# Patient Record
Sex: Male | Born: 1945
Health system: Western US, Academic
[De-identification: ages and names within clinical notes are randomized; demographics above are authoritative.]

## PROBLEM LIST (undated history)

## (undated) DIAGNOSIS — J449 Chronic obstructive pulmonary disease, unspecified: Secondary | ICD-10-CM

## (undated) DIAGNOSIS — S60459A Superficial foreign body of unspecified finger, initial encounter: Secondary | ICD-10-CM

## (undated) DIAGNOSIS — G459 Transient cerebral ischemic attack, unspecified: Secondary | ICD-10-CM

## (undated) DIAGNOSIS — I10 Essential (primary) hypertension: Secondary | ICD-10-CM

## (undated) DIAGNOSIS — C801 Malignant (primary) neoplasm, unspecified: Secondary | ICD-10-CM

## (undated) DIAGNOSIS — K219 Gastro-esophageal reflux disease without esophagitis: Secondary | ICD-10-CM

## (undated) DIAGNOSIS — G4730 Sleep apnea, unspecified: Secondary | ICD-10-CM

## (undated) DIAGNOSIS — I509 Heart failure, unspecified: Secondary | ICD-10-CM

## (undated) DIAGNOSIS — H269 Unspecified cataract: Secondary | ICD-10-CM

## (undated) DIAGNOSIS — D72819 Decreased white blood cell count, unspecified: Secondary | ICD-10-CM

## (undated) DIAGNOSIS — I4891 Unspecified atrial fibrillation: Secondary | ICD-10-CM

## (undated) DIAGNOSIS — L089 Local infection of the skin and subcutaneous tissue, unspecified: Secondary | ICD-10-CM

## (undated) DIAGNOSIS — M1990 Unspecified osteoarthritis, unspecified site: Secondary | ICD-10-CM

## (undated) DIAGNOSIS — Q249 Congenital malformation of heart, unspecified: Secondary | ICD-10-CM

## (undated) DIAGNOSIS — I2510 Atherosclerotic heart disease of native coronary artery without angina pectoris: Secondary | ICD-10-CM

## (undated) HISTORY — DX: Sleep apnea, unspecified: G47.30

## (undated) HISTORY — DX: Essential (primary) hypertension: I10

## (undated) HISTORY — DX: Chronic obstructive pulmonary disease, unspecified: J44.9

## (undated) HISTORY — DX: Unspecified osteoarthritis, unspecified site: M19.90

## (undated) HISTORY — PX: OTHER SURGICAL HISTORY: SHX169

## (undated) HISTORY — DX: Transient cerebral ischemic attack, unspecified: G45.9

## (undated) HISTORY — DX: Local infection of the skin and subcutaneous tissue, unspecified: L08.9

## (undated) HISTORY — DX: Malignant (primary) neoplasm, unspecified: C80.1

## (undated) HISTORY — DX: Congenital malformation of heart, unspecified: Q24.9

## (undated) HISTORY — DX: Superficial foreign body of unspecified finger, initial encounter: S60.459A

## (undated) HISTORY — DX: Unspecified atrial fibrillation: I48.91

## (undated) HISTORY — DX: Atherosclerotic heart disease of native coronary artery without angina pectoris: I25.10

## (undated) HISTORY — PX: TONSILLECTOMY: SUR1361

## (undated) HISTORY — DX: Gastro-esophageal reflux disease without esophagitis: K21.9

## (undated) HISTORY — DX: Decreased white blood cell count, unspecified: D72.819

## (undated) HISTORY — PX: CATARACT EXTRACTION: SUR2

## (undated) HISTORY — DX: Unspecified cataract: H26.9

---

## 1999-01-31 ENCOUNTER — Ambulatory Visit (HOSPITAL_COMMUNITY): Admission: RE | Admit: 1999-01-31 | Discharge: 1999-01-31 | Payer: Self-pay | Admitting: Cardiology

## 2000-05-22 ENCOUNTER — Encounter: Payer: Self-pay | Admitting: Nephrology

## 2000-05-22 ENCOUNTER — Encounter: Admission: RE | Admit: 2000-05-22 | Discharge: 2000-05-22 | Payer: Self-pay | Admitting: Nephrology

## 2001-05-11 DIAGNOSIS — K449 Diaphragmatic hernia without obstruction or gangrene: Secondary | ICD-10-CM | POA: Insufficient documentation

## 2001-05-12 ENCOUNTER — Encounter: Payer: Self-pay | Admitting: Gastroenterology

## 2004-07-25 ENCOUNTER — Ambulatory Visit: Payer: Self-pay | Admitting: Gastroenterology

## 2004-08-09 ENCOUNTER — Ambulatory Visit: Payer: Self-pay | Admitting: Cardiovascular Disease

## 2004-08-14 ENCOUNTER — Ambulatory Visit: Payer: Self-pay | Admitting: Gastroenterology

## 2004-09-07 ENCOUNTER — Ambulatory Visit: Payer: Self-pay | Admitting: Internal Medicine

## 2004-09-11 ENCOUNTER — Ambulatory Visit: Payer: Self-pay | Admitting: Cardiology

## 2004-10-09 ENCOUNTER — Ambulatory Visit: Payer: Self-pay | Admitting: *Deleted

## 2004-10-30 ENCOUNTER — Ambulatory Visit: Payer: Self-pay | Admitting: Cardiology

## 2004-11-14 ENCOUNTER — Ambulatory Visit: Payer: Self-pay | Admitting: *Deleted

## 2004-12-11 ENCOUNTER — Ambulatory Visit: Payer: Self-pay | Admitting: *Deleted

## 2005-01-01 ENCOUNTER — Ambulatory Visit: Payer: Self-pay | Admitting: Cardiology

## 2005-01-18 ENCOUNTER — Ambulatory Visit: Payer: Self-pay | Admitting: Internal Medicine

## 2005-01-22 ENCOUNTER — Ambulatory Visit (HOSPITAL_COMMUNITY): Admission: RE | Admit: 2005-01-22 | Discharge: 2005-01-22 | Payer: Self-pay | Admitting: Internal Medicine

## 2005-01-29 ENCOUNTER — Ambulatory Visit: Payer: Self-pay | Admitting: Cardiology

## 2005-02-09 ENCOUNTER — Emergency Department (HOSPITAL_COMMUNITY): Admission: EM | Admit: 2005-02-09 | Discharge: 2005-02-09 | Payer: Self-pay | Admitting: Emergency Medicine

## 2005-02-13 ENCOUNTER — Ambulatory Visit: Payer: Self-pay | Admitting: Internal Medicine

## 2005-02-28 ENCOUNTER — Ambulatory Visit: Payer: Self-pay | Admitting: Cardiovascular Disease

## 2005-02-28 ENCOUNTER — Ambulatory Visit: Payer: Self-pay | Admitting: Cardiology

## 2005-03-02 ENCOUNTER — Ambulatory Visit (HOSPITAL_COMMUNITY): Admission: RE | Admit: 2005-03-02 | Discharge: 2005-03-02 | Payer: Self-pay | Admitting: Neurology

## 2005-03-14 ENCOUNTER — Ambulatory Visit: Payer: Self-pay | Admitting: Cardiology

## 2005-04-03 ENCOUNTER — Ambulatory Visit: Payer: Self-pay | Admitting: Cardiology

## 2005-05-01 ENCOUNTER — Ambulatory Visit: Payer: Self-pay | Admitting: Cardiology

## 2005-05-14 ENCOUNTER — Ambulatory Visit: Payer: Self-pay | Admitting: Cardiology

## 2005-05-30 ENCOUNTER — Ambulatory Visit: Payer: Self-pay | Admitting: Cardiovascular Disease

## 2005-06-14 ENCOUNTER — Ambulatory Visit: Payer: Self-pay | Admitting: Internal Medicine

## 2005-06-20 ENCOUNTER — Ambulatory Visit: Payer: Self-pay | Admitting: Cardiology

## 2005-06-29 ENCOUNTER — Ambulatory Visit: Payer: Self-pay | Admitting: Internal Medicine

## 2005-07-18 ENCOUNTER — Ambulatory Visit: Payer: Self-pay | Admitting: Cardiology

## 2005-08-13 ENCOUNTER — Ambulatory Visit: Payer: Self-pay | Admitting: Internal Medicine

## 2005-08-13 ENCOUNTER — Ambulatory Visit: Payer: Self-pay | Admitting: Cardiology

## 2005-08-27 ENCOUNTER — Ambulatory Visit: Payer: Self-pay | Admitting: Internal Medicine

## 2005-09-10 ENCOUNTER — Ambulatory Visit: Payer: Self-pay | Admitting: Internal Medicine

## 2005-09-21 ENCOUNTER — Ambulatory Visit: Payer: Self-pay | Admitting: Cardiology

## 2005-10-08 ENCOUNTER — Ambulatory Visit: Payer: Self-pay | Admitting: Internal Medicine

## 2005-10-29 ENCOUNTER — Ambulatory Visit: Payer: Self-pay | Admitting: Cardiology

## 2005-11-05 ENCOUNTER — Emergency Department (HOSPITAL_COMMUNITY): Admission: EM | Admit: 2005-11-05 | Discharge: 2005-11-05 | Payer: Self-pay | Admitting: Emergency Medicine

## 2005-11-05 ENCOUNTER — Ambulatory Visit: Payer: Self-pay | Admitting: Internal Medicine

## 2005-11-07 ENCOUNTER — Ambulatory Visit: Payer: Self-pay | Admitting: Internal Medicine

## 2005-11-12 ENCOUNTER — Ambulatory Visit: Payer: Self-pay | Admitting: Cardiology

## 2005-11-13 ENCOUNTER — Ambulatory Visit: Payer: Self-pay | Admitting: Internal Medicine

## 2005-12-04 ENCOUNTER — Ambulatory Visit: Payer: Self-pay | Admitting: Internal Medicine

## 2005-12-05 ENCOUNTER — Ambulatory Visit: Payer: Self-pay | Admitting: Internal Medicine

## 2005-12-05 ENCOUNTER — Ambulatory Visit: Payer: Self-pay | Admitting: Cardiology

## 2005-12-07 ENCOUNTER — Ambulatory Visit: Payer: Self-pay | Admitting: Emergency Medicine

## 2005-12-12 ENCOUNTER — Ambulatory Visit: Payer: Self-pay | Admitting: Cardiology

## 2005-12-12 ENCOUNTER — Ambulatory Visit: Payer: Self-pay

## 2005-12-14 ENCOUNTER — Inpatient Hospital Stay (HOSPITAL_BASED_OUTPATIENT_CLINIC_OR_DEPARTMENT_OTHER): Admission: RE | Admit: 2005-12-14 | Discharge: 2005-12-14 | Payer: Self-pay | Admitting: Cardiology

## 2005-12-14 ENCOUNTER — Ambulatory Visit: Payer: Self-pay | Admitting: Cardiology

## 2005-12-17 ENCOUNTER — Ambulatory Visit: Payer: Self-pay | Admitting: Cardiology

## 2005-12-18 ENCOUNTER — Ambulatory Visit: Payer: Self-pay | Admitting: Internal Medicine

## 2005-12-24 ENCOUNTER — Ambulatory Visit: Payer: Self-pay | Admitting: Cardiology

## 2005-12-24 ENCOUNTER — Ambulatory Visit: Payer: Self-pay | Admitting: Internal Medicine

## 2005-12-24 ENCOUNTER — Ambulatory Visit (HOSPITAL_COMMUNITY): Admission: RE | Admit: 2005-12-24 | Discharge: 2005-12-24 | Payer: Self-pay | Admitting: Internal Medicine

## 2006-01-01 ENCOUNTER — Ambulatory Visit: Payer: Self-pay | Admitting: Internal Medicine

## 2006-01-14 ENCOUNTER — Ambulatory Visit: Payer: Self-pay | Admitting: Cardiology

## 2006-02-11 ENCOUNTER — Ambulatory Visit: Payer: Self-pay | Admitting: Cardiology

## 2006-02-26 ENCOUNTER — Ambulatory Visit: Payer: Self-pay | Admitting: Internal Medicine

## 2006-03-26 ENCOUNTER — Ambulatory Visit: Payer: Self-pay | Admitting: Cardiology

## 2006-04-09 ENCOUNTER — Ambulatory Visit: Payer: Self-pay | Admitting: Internal Medicine

## 2006-04-22 ENCOUNTER — Ambulatory Visit: Payer: Self-pay | Admitting: Cardiology

## 2006-05-16 ENCOUNTER — Ambulatory Visit: Payer: Self-pay | Admitting: Cardiology

## 2006-06-13 ENCOUNTER — Ambulatory Visit: Payer: Self-pay | Admitting: Cardiology

## 2006-07-11 ENCOUNTER — Ambulatory Visit: Payer: Self-pay | Admitting: Cardiology

## 2006-07-22 ENCOUNTER — Ambulatory Visit: Payer: Self-pay | Admitting: Cardiology

## 2006-08-14 ENCOUNTER — Ambulatory Visit: Payer: Self-pay | Admitting: Internal Medicine

## 2006-08-23 ENCOUNTER — Ambulatory Visit: Payer: Self-pay | Admitting: Cardiology

## 2006-09-10 ENCOUNTER — Ambulatory Visit: Payer: Self-pay | Admitting: Cardiology

## 2006-09-26 ENCOUNTER — Ambulatory Visit: Payer: Self-pay | Admitting: Cardiology

## 2006-10-21 ENCOUNTER — Ambulatory Visit: Payer: Self-pay | Admitting: Cardiology

## 2006-11-11 ENCOUNTER — Ambulatory Visit: Payer: Self-pay | Admitting: Cardiology

## 2006-12-09 ENCOUNTER — Ambulatory Visit: Payer: Self-pay | Admitting: Cardiology

## 2006-12-09 ENCOUNTER — Ambulatory Visit: Payer: Self-pay | Admitting: Internal Medicine

## 2006-12-09 LAB — CONVERTED CEMR LAB
ALT: 16 units/L (ref 0–40)
BUN: 17 mg/dL (ref 6–23)
Chloride: 104 meq/L (ref 96–112)
Creatinine, Ser: 1.2 mg/dL (ref 0.4–1.5)
GFR calc Af Amer: 79 mL/min
Glucose, Bld: 91 mg/dL (ref 70–99)
HDL: 54.6 mg/dL (ref >39.0)
Potassium: 4.2 meq/L (ref 3.5–5.1)
Total Bilirubin: 1.2 mg/dL (ref 0.3–1.2)
Total Protein: 6.2 g/dL (ref 6.0–8.3)
Triglycerides: 32 mg/dL (ref 0–149)
VLDL: 6 mg/dL (ref 0–40)

## 2007-01-06 ENCOUNTER — Ambulatory Visit: Payer: Self-pay | Admitting: Cardiology

## 2007-01-31 ENCOUNTER — Ambulatory Visit: Payer: Self-pay | Admitting: Internal Medicine

## 2007-02-03 ENCOUNTER — Ambulatory Visit: Payer: Self-pay | Admitting: Cardiology

## 2007-02-03 ENCOUNTER — Ambulatory Visit: Payer: Self-pay | Admitting: Internal Medicine

## 2007-02-10 ENCOUNTER — Ambulatory Visit: Payer: Self-pay | Admitting: Internal Medicine

## 2007-02-20 ENCOUNTER — Ambulatory Visit: Payer: Self-pay | Admitting: Cardiology

## 2007-03-13 ENCOUNTER — Ambulatory Visit: Payer: Self-pay | Admitting: Cardiology

## 2007-04-10 ENCOUNTER — Ambulatory Visit: Payer: Self-pay | Admitting: Cardiology

## 2007-04-24 ENCOUNTER — Ambulatory Visit: Payer: Self-pay | Admitting: Cardiology

## 2007-05-14 ENCOUNTER — Ambulatory Visit: Payer: Self-pay | Admitting: Cardiology

## 2007-06-18 ENCOUNTER — Ambulatory Visit: Payer: Self-pay | Admitting: Cardiology

## 2007-06-27 ENCOUNTER — Encounter: Payer: Self-pay | Admitting: Internal Medicine

## 2007-07-09 ENCOUNTER — Ambulatory Visit: Payer: Self-pay | Admitting: Cardiology

## 2007-07-15 ENCOUNTER — Ambulatory Visit: Payer: Self-pay | Admitting: Gastroenterology

## 2007-07-15 LAB — CONVERTED CEMR LAB: Tissue Transglutaminase Ab, IgA: 0 units (ref <7)

## 2007-08-06 ENCOUNTER — Ambulatory Visit: Payer: Self-pay | Admitting: Cardiology

## 2007-09-03 ENCOUNTER — Ambulatory Visit: Payer: Self-pay | Admitting: Cardiology

## 2007-09-23 ENCOUNTER — Ambulatory Visit: Payer: Self-pay | Admitting: Gastroenterology

## 2007-09-23 ENCOUNTER — Ambulatory Visit: Payer: Self-pay | Admitting: Internal Medicine

## 2007-09-23 LAB — CONVERTED CEMR LAB
ALT: 20 units/L (ref 0–53)
AST: 32 units/L (ref 0–37)
Bilirubin, Direct: 0.2 mg/dL (ref 0.0–0.3)
Total Protein: 6.9 g/dL (ref 6.0–8.3)

## 2007-10-06 ENCOUNTER — Ambulatory Visit: Payer: Self-pay | Admitting: Internal Medicine

## 2007-10-17 ENCOUNTER — Ambulatory Visit: Payer: Self-pay | Admitting: Internal Medicine

## 2007-10-17 DIAGNOSIS — I4891 Unspecified atrial fibrillation: Secondary | ICD-10-CM | POA: Insufficient documentation

## 2007-10-17 DIAGNOSIS — H269 Unspecified cataract: Secondary | ICD-10-CM | POA: Insufficient documentation

## 2007-10-17 DIAGNOSIS — M8580 Other specified disorders of bone density and structure, unspecified site: Secondary | ICD-10-CM | POA: Insufficient documentation

## 2007-10-21 ENCOUNTER — Encounter: Payer: Self-pay | Admitting: Internal Medicine

## 2007-10-21 ENCOUNTER — Ambulatory Visit: Payer: Self-pay | Admitting: Cardiology

## 2007-10-21 DIAGNOSIS — D72819 Decreased white blood cell count, unspecified: Secondary | ICD-10-CM | POA: Insufficient documentation

## 2007-10-21 DIAGNOSIS — K219 Gastro-esophageal reflux disease without esophagitis: Secondary | ICD-10-CM | POA: Insufficient documentation

## 2007-10-21 DIAGNOSIS — IMO0002 Reserved for concepts with insufficient information to code with codable children: Secondary | ICD-10-CM | POA: Insufficient documentation

## 2007-10-21 DIAGNOSIS — M1710 Unilateral primary osteoarthritis, unspecified knee: Secondary | ICD-10-CM

## 2007-10-21 DIAGNOSIS — J449 Chronic obstructive pulmonary disease, unspecified: Secondary | ICD-10-CM | POA: Insufficient documentation

## 2007-10-21 DIAGNOSIS — I10 Essential (primary) hypertension: Secondary | ICD-10-CM | POA: Insufficient documentation

## 2007-10-21 DIAGNOSIS — J4489 Other specified chronic obstructive pulmonary disease: Secondary | ICD-10-CM | POA: Insufficient documentation

## 2007-10-21 DIAGNOSIS — I2510 Atherosclerotic heart disease of native coronary artery without angina pectoris: Secondary | ICD-10-CM | POA: Insufficient documentation

## 2007-10-27 ENCOUNTER — Encounter: Payer: Self-pay | Admitting: Internal Medicine

## 2007-10-29 ENCOUNTER — Ambulatory Visit (HOSPITAL_BASED_OUTPATIENT_CLINIC_OR_DEPARTMENT_OTHER): Admission: RE | Admit: 2007-10-29 | Discharge: 2007-10-29 | Payer: Self-pay | Admitting: Internal Medicine

## 2007-10-29 ENCOUNTER — Encounter: Payer: Self-pay | Admitting: Pulmonary Disease

## 2007-11-04 ENCOUNTER — Ambulatory Visit: Payer: Self-pay | Admitting: Internal Medicine

## 2007-11-11 ENCOUNTER — Ambulatory Visit: Payer: Self-pay | Admitting: Pulmonary Disease

## 2007-11-12 ENCOUNTER — Ambulatory Visit: Payer: Self-pay | Admitting: Cardiology

## 2007-11-15 ENCOUNTER — Encounter: Payer: Self-pay | Admitting: Gastroenterology

## 2007-11-21 ENCOUNTER — Ambulatory Visit: Payer: Self-pay

## 2007-11-21 ENCOUNTER — Encounter: Payer: Self-pay | Admitting: Internal Medicine

## 2007-11-28 DIAGNOSIS — F528 Other sexual dysfunction not due to a substance or known physiological condition: Secondary | ICD-10-CM | POA: Insufficient documentation

## 2007-11-28 DIAGNOSIS — Z87898 Personal history of other specified conditions: Secondary | ICD-10-CM | POA: Insufficient documentation

## 2007-12-01 ENCOUNTER — Ambulatory Visit: Payer: Self-pay | Admitting: Cardiology

## 2007-12-04 ENCOUNTER — Ambulatory Visit: Payer: Self-pay | Admitting: Emergency Medicine

## 2007-12-04 DIAGNOSIS — G4733 Obstructive sleep apnea (adult) (pediatric): Secondary | ICD-10-CM | POA: Insufficient documentation

## 2007-12-10 ENCOUNTER — Ambulatory Visit: Payer: Self-pay | Admitting: Cardiology

## 2007-12-22 ENCOUNTER — Ambulatory Visit: Payer: Self-pay | Admitting: Cardiology

## 2007-12-22 LAB — CONVERTED CEMR LAB: Prothrombin Time: 12.5 s (ref 10.9–13.3)

## 2007-12-25 ENCOUNTER — Ambulatory Visit: Payer: Self-pay

## 2007-12-25 ENCOUNTER — Ambulatory Visit: Payer: Self-pay | Admitting: Cardiology

## 2007-12-25 LAB — CONVERTED CEMR LAB: Prothrombin Time: 15.6 s — ABNORMAL HIGH (ref 10.9–13.3)

## 2007-12-31 ENCOUNTER — Ambulatory Visit: Payer: Self-pay | Admitting: Internal Medicine

## 2008-01-05 ENCOUNTER — Ambulatory Visit (HOSPITAL_BASED_OUTPATIENT_CLINIC_OR_DEPARTMENT_OTHER): Admission: RE | Admit: 2008-01-05 | Discharge: 2008-01-05 | Payer: Self-pay | Admitting: Emergency Medicine

## 2008-01-08 ENCOUNTER — Ambulatory Visit: Payer: Self-pay | Admitting: Cardiology

## 2008-01-13 ENCOUNTER — Ambulatory Visit: Payer: Self-pay | Admitting: Cardiology

## 2008-01-15 ENCOUNTER — Ambulatory Visit: Payer: Self-pay | Admitting: Internal Medicine

## 2008-01-20 ENCOUNTER — Ambulatory Visit: Payer: Self-pay | Admitting: Pulmonary Disease

## 2008-01-21 ENCOUNTER — Ambulatory Visit: Payer: Self-pay | Admitting: Internal Medicine

## 2008-01-21 ENCOUNTER — Ambulatory Visit: Payer: Self-pay | Admitting: Cardiology

## 2008-01-28 ENCOUNTER — Ambulatory Visit: Payer: Self-pay | Admitting: Cardiology

## 2008-02-05 ENCOUNTER — Encounter: Payer: Self-pay | Admitting: Emergency Medicine

## 2008-02-05 ENCOUNTER — Ambulatory Visit: Payer: Self-pay | Admitting: Internal Medicine

## 2008-02-11 ENCOUNTER — Ambulatory Visit: Payer: Self-pay | Admitting: Cardiovascular Disease

## 2008-02-18 ENCOUNTER — Ambulatory Visit: Payer: Self-pay | Admitting: Cardiology

## 2008-02-25 ENCOUNTER — Ambulatory Visit: Payer: Self-pay | Admitting: Cardiology

## 2008-03-03 ENCOUNTER — Ambulatory Visit: Payer: Self-pay | Admitting: Cardiology

## 2008-03-04 ENCOUNTER — Encounter: Payer: Self-pay | Admitting: Emergency Medicine

## 2008-03-10 ENCOUNTER — Ambulatory Visit: Payer: Self-pay | Admitting: Cardiology

## 2008-03-17 ENCOUNTER — Encounter: Payer: Self-pay | Admitting: Gastroenterology

## 2008-03-17 ENCOUNTER — Ambulatory Visit: Payer: Self-pay | Admitting: Cardiovascular Disease

## 2008-03-17 ENCOUNTER — Encounter: Payer: Self-pay | Admitting: Internal Medicine

## 2008-03-19 ENCOUNTER — Encounter: Payer: Self-pay | Admitting: Internal Medicine

## 2008-03-19 ENCOUNTER — Encounter: Payer: Self-pay | Admitting: Emergency Medicine

## 2008-03-24 ENCOUNTER — Ambulatory Visit: Payer: Self-pay | Admitting: Cardiology

## 2008-04-06 ENCOUNTER — Ambulatory Visit: Payer: Self-pay | Admitting: Cardiology

## 2008-04-14 ENCOUNTER — Ambulatory Visit: Payer: Self-pay | Admitting: Internal Medicine

## 2008-04-19 ENCOUNTER — Ambulatory Visit: Payer: Self-pay | Admitting: Internal Medicine

## 2008-04-19 LAB — CONVERTED CEMR LAB
Albumin: 4 g/dL (ref 3.5–5.2)
BUN: 13 mg/dL (ref 6–23)
Basophils Absolute: 0 10*3/uL (ref 0.0–0.1)
CO2: 28 meq/L (ref 19–32)
Chloride: 107 meq/L (ref 96–112)
Creatinine, Ser: 0.8 mg/dL (ref 0.4–1.5)
Eosinophils Relative: 4.4 % (ref 0.0–5.0)
Hemoglobin, Urine: NEGATIVE
Hemoglobin: 15.3 g/dL (ref 13.0–17.0)
LDL Cholesterol: 60 mg/dL (ref 0–99)
Lymphocytes Relative: 26.7 % (ref 12.0–46.0)
MCHC: 33.9 g/dL (ref 30.0–36.0)
Monocytes Relative: 14.5 % — ABNORMAL HIGH (ref 3.0–12.0)
Nitrite: NEGATIVE
PSA: 0.57 ng/mL (ref 0.10–4.00)
Platelets: 155 10*3/uL (ref 150–400)
RDW: 12.8 % (ref 11.5–14.6)
Specific Gravity, Urine: 1.015 (ref 1.000–1.03)
Total CHOL/HDL Ratio: 2.4
Total Protein: 6.6 g/dL (ref 6.0–8.3)
Triglycerides: 34 mg/dL (ref 0–149)
VLDL: 7 mg/dL (ref 0–40)
WBC: 2.7 10*3/uL — ABNORMAL LOW (ref 4.5–10.5)
pH: 7 (ref 5.0–8.0)

## 2008-04-22 ENCOUNTER — Telehealth: Payer: Self-pay | Admitting: Internal Medicine

## 2008-04-23 ENCOUNTER — Ambulatory Visit: Payer: Self-pay | Admitting: Cardiovascular Disease

## 2008-05-14 ENCOUNTER — Ambulatory Visit: Payer: Self-pay | Admitting: Internal Medicine

## 2008-05-24 ENCOUNTER — Ambulatory Visit: Payer: Self-pay | Admitting: Cardiology

## 2008-06-11 ENCOUNTER — Ambulatory Visit: Payer: Self-pay | Admitting: Internal Medicine

## 2008-06-14 ENCOUNTER — Ambulatory Visit: Payer: Self-pay | Admitting: Internal Medicine

## 2008-07-02 ENCOUNTER — Ambulatory Visit: Payer: Self-pay | Admitting: Internal Medicine

## 2008-07-26 ENCOUNTER — Ambulatory Visit: Payer: Self-pay | Admitting: Internal Medicine

## 2008-07-30 ENCOUNTER — Ambulatory Visit: Payer: Self-pay | Admitting: Cardiology

## 2008-08-27 ENCOUNTER — Ambulatory Visit: Payer: Self-pay | Admitting: Internal Medicine

## 2008-09-10 ENCOUNTER — Ambulatory Visit: Payer: Self-pay | Admitting: Cardiology

## 2008-10-08 ENCOUNTER — Ambulatory Visit: Payer: Self-pay | Admitting: Cardiovascular Disease

## 2008-10-12 ENCOUNTER — Ambulatory Visit: Payer: Self-pay

## 2008-10-21 ENCOUNTER — Encounter: Payer: Self-pay | Admitting: Emergency Medicine

## 2008-11-01 ENCOUNTER — Encounter: Payer: Self-pay | Admitting: Emergency Medicine

## 2008-11-05 ENCOUNTER — Ambulatory Visit: Payer: Self-pay | Admitting: Cardiology

## 2008-11-10 ENCOUNTER — Encounter: Payer: Self-pay | Admitting: Internal Medicine

## 2008-11-10 ENCOUNTER — Ambulatory Visit: Payer: Self-pay

## 2008-11-15 ENCOUNTER — Encounter: Payer: Self-pay | Admitting: Cardiology

## 2008-11-15 ENCOUNTER — Ambulatory Visit: Payer: Self-pay | Admitting: Cardiology

## 2008-11-17 ENCOUNTER — Ambulatory Visit: Payer: Self-pay | Admitting: Cardiology

## 2008-11-17 LAB — CONVERTED CEMR LAB
ALT: 13 units/L (ref 0–53)
AST: 25 units/L (ref 0–37)
Albumin: 3.9 g/dL (ref 3.5–5.2)
BUN: 12 mg/dL (ref 6–23)
Basophils Absolute: 0 10*3/uL (ref 0.0–0.1)
Basophils Relative: 0.6 % (ref 0.0–3.0)
Chloride: 104 meq/L (ref 96–112)
Eosinophils Absolute: 0.1 10*3/uL (ref 0.0–0.7)
Eosinophils Relative: 4.1 % (ref 0.0–5.0)
GFR calc Af Amer: 110 mL/min
HDL: 51.5 mg/dL (ref >39.0)
MCV: 91.3 fL (ref 78.0–100.0)
Monocytes Relative: 12.5 % — ABNORMAL HIGH (ref 3.0–12.0)
Neutro Abs: 1.3 10*3/uL — ABNORMAL LOW (ref 1.4–7.7)
Neutrophils Relative %: 53.8 % (ref 43.0–77.0)
Potassium: 4.3 meq/L (ref 3.5–5.1)
RDW: 12.8 % (ref 11.5–14.6)
Total Protein: 6.3 g/dL (ref 6.0–8.3)
Triglycerides: 31 mg/dL (ref 0–149)
VLDL: 6 mg/dL (ref 0–40)

## 2008-11-26 ENCOUNTER — Ambulatory Visit: Payer: Self-pay | Admitting: Internal Medicine

## 2008-12-10 ENCOUNTER — Ambulatory Visit: Payer: Self-pay | Admitting: Cardiology

## 2008-12-13 ENCOUNTER — Ambulatory Visit: Payer: Self-pay | Admitting: Internal Medicine

## 2008-12-13 ENCOUNTER — Encounter: Payer: Self-pay | Admitting: Internal Medicine

## 2008-12-27 ENCOUNTER — Ambulatory Visit: Payer: Self-pay | Admitting: Cardiology

## 2009-01-03 ENCOUNTER — Ambulatory Visit: Payer: Self-pay | Admitting: Internal Medicine

## 2009-01-10 ENCOUNTER — Ambulatory Visit: Payer: Self-pay | Admitting: Cardiovascular Disease

## 2009-01-17 ENCOUNTER — Ambulatory Visit: Payer: Self-pay | Admitting: Internal Medicine

## 2009-01-26 ENCOUNTER — Encounter: Payer: Self-pay | Admitting: Internal Medicine

## 2009-01-27 ENCOUNTER — Encounter: Payer: Self-pay | Admitting: Internal Medicine

## 2009-01-31 ENCOUNTER — Ambulatory Visit: Payer: Self-pay | Admitting: Cardiovascular Disease

## 2009-02-02 ENCOUNTER — Ambulatory Visit: Payer: Self-pay | Admitting: Cardiology

## 2009-02-09 ENCOUNTER — Ambulatory Visit: Payer: Self-pay | Admitting: Cardiology

## 2009-02-13 ENCOUNTER — Encounter: Payer: Self-pay | Admitting: Internal Medicine

## 2009-02-18 ENCOUNTER — Telehealth: Payer: Self-pay | Admitting: Internal Medicine

## 2009-02-22 ENCOUNTER — Encounter: Payer: Self-pay | Admitting: *Deleted

## 2009-02-23 ENCOUNTER — Ambulatory Visit: Payer: Self-pay | Admitting: Cardiology

## 2009-02-23 LAB — CONVERTED CEMR LAB: POC INR: 2.4

## 2009-03-10 ENCOUNTER — Ambulatory Visit: Payer: Self-pay | Admitting: Internal Medicine

## 2009-03-15 ENCOUNTER — Ambulatory Visit: Payer: Self-pay | Admitting: Internal Medicine

## 2009-03-15 ENCOUNTER — Encounter: Payer: Self-pay | Admitting: Internal Medicine

## 2009-03-21 ENCOUNTER — Encounter: Payer: Self-pay | Admitting: Emergency Medicine

## 2009-03-29 ENCOUNTER — Encounter: Payer: Self-pay | Admitting: Internal Medicine

## 2009-03-30 ENCOUNTER — Encounter: Payer: Self-pay | Admitting: *Deleted

## 2009-04-05 ENCOUNTER — Ambulatory Visit: Payer: Self-pay | Admitting: Cardiology

## 2009-04-05 LAB — CONVERTED CEMR LAB
POC INR: 2.3
Prothrombin Time: 18.4 s

## 2009-05-03 ENCOUNTER — Ambulatory Visit: Payer: Self-pay | Admitting: Internal Medicine

## 2009-05-03 LAB — CONVERTED CEMR LAB: POC INR: 2.3

## 2009-05-19 ENCOUNTER — Encounter: Payer: Self-pay | Admitting: Internal Medicine

## 2009-05-25 ENCOUNTER — Ambulatory Visit: Payer: Self-pay | Admitting: Emergency Medicine

## 2009-05-31 ENCOUNTER — Ambulatory Visit: Payer: Self-pay | Admitting: Cardiology

## 2009-05-31 LAB — CONVERTED CEMR LAB: POC INR: 2.7

## 2009-06-20 ENCOUNTER — Ambulatory Visit: Payer: Self-pay | Admitting: Internal Medicine

## 2009-06-20 LAB — CONVERTED CEMR LAB
Albumin: 4.1 g/dL (ref 3.5–5.2)
BUN: 11 mg/dL (ref 6–23)
Basophils Relative: 1 % (ref 0.0–3.0)
Bilirubin Urine: NEGATIVE
CO2: 27 meq/L (ref 19–32)
Creatinine, Ser: 1 mg/dL (ref 0.4–1.5)
Eosinophils Absolute: 0.1 10*3/uL (ref 0.0–0.7)
Eosinophils Relative: 4.1 % (ref 0.0–5.0)
HDL: 51.9 mg/dL (ref >39.00)
Hemoglobin, Urine: NEGATIVE
Hemoglobin: 15.3 g/dL (ref 13.0–17.0)
Ketones, ur: NEGATIVE mg/dL
Leukocytes, UA: NEGATIVE
MCHC: 33.9 g/dL (ref 30.0–36.0)
PSA: 0.46 ng/mL (ref 0.10–4.00)
RBC: 4.92 M/uL (ref 4.22–5.81)
RDW: 12.8 % (ref 11.5–14.6)
Specific Gravity, Urine: 1.02 (ref 1.000–1.030)
TSH: 1.28 microintl units/mL (ref 0.35–5.50)
Total Bilirubin: 1.5 mg/dL — ABNORMAL HIGH (ref 0.3–1.2)
Total Protein, Urine: NEGATIVE mg/dL
Triglycerides: 35 mg/dL (ref 0.0–149.0)
Urine Glucose: NEGATIVE mg/dL
VLDL: 7 mg/dL (ref 0.0–40.0)
WBC: 3.1 10*3/uL — ABNORMAL LOW (ref 4.5–10.5)

## 2009-06-23 ENCOUNTER — Ambulatory Visit: Payer: Self-pay | Admitting: Internal Medicine

## 2009-06-27 ENCOUNTER — Ambulatory Visit: Payer: Self-pay | Admitting: Internal Medicine

## 2009-06-27 LAB — CONVERTED CEMR LAB: POC INR: 3

## 2009-07-25 ENCOUNTER — Ambulatory Visit: Payer: Self-pay | Admitting: Cardiology

## 2009-07-25 LAB — CONVERTED CEMR LAB: POC INR: 2.1

## 2009-08-11 ENCOUNTER — Ambulatory Visit: Payer: Self-pay | Admitting: Gastroenterology

## 2009-08-22 ENCOUNTER — Telehealth (INDEPENDENT_AMBULATORY_CARE_PROVIDER_SITE_OTHER): Payer: Self-pay | Admitting: *Deleted

## 2009-08-22 ENCOUNTER — Ambulatory Visit: Payer: Self-pay | Admitting: Cardiology

## 2009-08-22 LAB — CONVERTED CEMR LAB: POC INR: 3

## 2009-08-24 ENCOUNTER — Telehealth: Payer: Self-pay | Admitting: Internal Medicine

## 2009-08-30 ENCOUNTER — Ambulatory Visit: Payer: Self-pay | Admitting: Internal Medicine

## 2009-09-06 ENCOUNTER — Telehealth: Payer: Self-pay | Admitting: Cardiology

## 2009-09-07 ENCOUNTER — Ambulatory Visit: Payer: Self-pay | Admitting: Gastroenterology

## 2009-09-07 LAB — CONVERTED CEMR LAB: UREASE: NEGATIVE

## 2009-09-13 ENCOUNTER — Ambulatory Visit: Payer: Self-pay | Admitting: Internal Medicine

## 2009-09-30 ENCOUNTER — Ambulatory Visit: Payer: Self-pay | Admitting: Cardiology

## 2009-10-31 ENCOUNTER — Ambulatory Visit: Payer: Self-pay | Admitting: Cardiology

## 2009-10-31 ENCOUNTER — Ambulatory Visit: Payer: Self-pay | Admitting: Cardiovascular Disease

## 2009-10-31 DIAGNOSIS — E785 Hyperlipidemia, unspecified: Secondary | ICD-10-CM | POA: Insufficient documentation

## 2009-11-07 ENCOUNTER — Telehealth (INDEPENDENT_AMBULATORY_CARE_PROVIDER_SITE_OTHER): Payer: Self-pay | Admitting: *Deleted

## 2009-11-08 ENCOUNTER — Ambulatory Visit: Payer: Self-pay | Admitting: Cardiology

## 2009-11-08 ENCOUNTER — Encounter (HOSPITAL_COMMUNITY): Admission: RE | Admit: 2009-11-08 | Discharge: 2009-12-28 | Payer: Self-pay | Admitting: Internal Medicine

## 2009-11-08 ENCOUNTER — Ambulatory Visit: Payer: Self-pay

## 2009-11-23 ENCOUNTER — Ambulatory Visit: Payer: Self-pay | Admitting: Cardiovascular Disease

## 2009-11-23 ENCOUNTER — Encounter (INDEPENDENT_AMBULATORY_CARE_PROVIDER_SITE_OTHER): Payer: Self-pay | Admitting: Cardiology

## 2009-12-22 ENCOUNTER — Ambulatory Visit: Payer: Self-pay | Admitting: Internal Medicine

## 2010-01-19 ENCOUNTER — Ambulatory Visit: Payer: Self-pay | Admitting: Cardiology

## 2010-01-19 LAB — CONVERTED CEMR LAB: POC INR: 2.5

## 2010-02-16 ENCOUNTER — Ambulatory Visit: Payer: Self-pay | Admitting: Cardiology

## 2010-02-16 LAB — CONVERTED CEMR LAB: POC INR: 1.9

## 2010-03-16 ENCOUNTER — Ambulatory Visit: Payer: Self-pay | Admitting: Internal Medicine

## 2010-03-16 LAB — CONVERTED CEMR LAB: POC INR: 2.3

## 2010-04-20 ENCOUNTER — Ambulatory Visit: Payer: Self-pay | Admitting: Internal Medicine

## 2010-04-20 LAB — CONVERTED CEMR LAB: POC INR: 2.2

## 2010-05-18 ENCOUNTER — Ambulatory Visit: Payer: Self-pay | Admitting: Internal Medicine

## 2010-05-18 LAB — CONVERTED CEMR LAB: POC INR: 2.5

## 2010-06-13 ENCOUNTER — Ambulatory Visit: Payer: Self-pay | Admitting: Internal Medicine

## 2010-06-13 LAB — CONVERTED CEMR LAB
ALT: 19 units/L (ref 0–53)
Albumin: 4.2 g/dL (ref 3.5–5.2)
Basophils Absolute: 0 10*3/uL (ref 0.0–0.1)
Basophils Relative: 1.1 % (ref 0.0–3.0)
Bilirubin, Direct: 0.2 mg/dL (ref 0.0–0.3)
Chloride: 105 meq/L (ref 96–112)
GFR calc non Af Amer: 101.81 mL/min (ref >60)
Glucose, Bld: 89 mg/dL (ref 70–99)
Leukocytes, UA: NEGATIVE
Lymphocytes Relative: 28.7 % (ref 12.0–46.0)
Lymphs Abs: 0.8 10*3/uL (ref 0.7–4.0)
MCHC: 34.7 g/dL (ref 30.0–36.0)
MCV: 91.1 fL (ref 78.0–100.0)
Monocytes Relative: 10.5 % (ref 3.0–12.0)
Neutro Abs: 1.6 10*3/uL (ref 1.4–7.7)
Neutrophils Relative %: 55.3 % (ref 43.0–77.0)
Nitrite: NEGATIVE
Potassium: 4.3 meq/L (ref 3.5–5.1)
RBC: 4.9 M/uL (ref 4.22–5.81)
RDW: 14 % (ref 11.5–14.6)
Sodium: 140 meq/L (ref 135–145)
TSH: 1.52 microintl units/mL (ref 0.35–5.50)
Total Bilirubin: 1 mg/dL (ref 0.3–1.2)
Total Protein, Urine: NEGATIVE mg/dL
Total Protein: 6.5 g/dL (ref 6.0–8.3)
Triglycerides: 36 mg/dL (ref 0.0–149.0)
VLDL: 7.2 mg/dL (ref 0.0–40.0)
pH: 7.5 (ref 5.0–8.0)

## 2010-06-14 ENCOUNTER — Telehealth: Payer: Self-pay | Admitting: Internal Medicine

## 2010-06-15 ENCOUNTER — Ambulatory Visit: Payer: Self-pay | Admitting: Cardiology

## 2010-06-15 LAB — CONVERTED CEMR LAB: POC INR: 2.1

## 2010-06-19 ENCOUNTER — Ambulatory Visit: Payer: Self-pay | Admitting: Internal Medicine

## 2010-06-22 ENCOUNTER — Telehealth (INDEPENDENT_AMBULATORY_CARE_PROVIDER_SITE_OTHER): Payer: Self-pay | Admitting: *Deleted

## 2010-06-23 ENCOUNTER — Encounter: Payer: Self-pay | Admitting: Internal Medicine

## 2010-06-26 ENCOUNTER — Ambulatory Visit: Payer: Self-pay | Admitting: Cardiovascular Disease

## 2010-06-26 ENCOUNTER — Encounter: Payer: Self-pay | Admitting: Cardiovascular Disease

## 2010-06-26 ENCOUNTER — Ambulatory Visit: Payer: Self-pay

## 2010-06-26 ENCOUNTER — Encounter (HOSPITAL_COMMUNITY): Admission: RE | Admit: 2010-06-26 | Discharge: 2010-07-10 | Payer: Self-pay | Admitting: Internal Medicine

## 2010-06-27 ENCOUNTER — Ambulatory Visit: Payer: Self-pay | Admitting: Internal Medicine

## 2010-06-27 LAB — CONVERTED CEMR LAB: Testosterone: 459.11 ng/dL (ref 350.00–890.00)

## 2010-07-13 ENCOUNTER — Ambulatory Visit: Payer: Self-pay | Admitting: Cardiovascular Disease

## 2010-07-13 LAB — CONVERTED CEMR LAB: POC INR: 2.2

## 2010-08-04 ENCOUNTER — Encounter: Payer: Self-pay | Admitting: Emergency Medicine

## 2010-08-09 ENCOUNTER — Ambulatory Visit: Payer: Self-pay | Admitting: Cardiology

## 2010-08-09 LAB — CONVERTED CEMR LAB: POC INR: 2.3

## 2010-08-24 ENCOUNTER — Ambulatory Visit: Payer: Self-pay | Admitting: Cardiovascular Disease

## 2010-08-24 LAB — CONVERTED CEMR LAB: POC INR: 2.7

## 2010-09-06 ENCOUNTER — Ambulatory Visit: Payer: Self-pay

## 2010-09-14 ENCOUNTER — Ambulatory Visit: Payer: Self-pay | Admitting: Cardiology

## 2010-10-16 ENCOUNTER — Ambulatory Visit: Admission: RE | Admit: 2010-10-16 | Discharge: 2010-10-16 | Payer: Self-pay | Source: Home / Self Care

## 2010-10-24 NOTE — Medication Information (Signed)
Summary: rov/sp      Allergies Added: NKDA Anticoagulant Therapy  Managed by: Louie Casa, PharmD Referring MD: Sherryl Manges MD PCP: Jacques Navy MD Supervising MD: Excell Seltzer MD, Casimiro Needle Indication 1: Atrial Flutter (ICD-427.32) Indication 2: AMIODARONE (ICD-amio) Lab Used: [facility] Site: [facility] INR POC 2.4 INR RANGE 2 - 3  Dietary changes: no    Health status changes: no    Bleeding/hemorrhagic complications: no    Recent/future hospitalizations: no    Any changes in medication regimen? no    Recent/future dental: no  Any missed doses?: no       Is patient compliant with meds? yes       Current Medications (verified): 1)  Altace 10 Mg Caps (Ramipril) .... Take 1 Tablet By Mouth Once A Day 2)  Lipitor 20 Mg Tabs (Atorvastatin Calcium) .... Take 1 Tablet By Mouth Once A Day 3)  Proscar 5 Mg Tabs (Finasteride) .... Take 1 Tablet By Mouth Once A Day 4)  Adult Aspirin Ec Low Strength 81 Mg  Tbec (Aspirin) .... Take 1 Tablet By Mouth Once A Day 5)  Multivitamins   Tabs (Multiple Vitamin) .... Take 1 Tablet By Mouth Once A Day 6)  Oscal 500/200 D-3 500-200 Mg-Unit  Tabs (Calcium-Vitamin D) .... Take 2 in Am 1 in Pm 7)  Fish Oil 1200 Mg  Caps (Omega-3 Fatty Acids) .... Take 1 Tablet By Mouth Once A Day 8)  Cod Liver Oil   Caps (Cod Liver Oil) .... Take 1 Tablet By Mouth Once A Day 9)  Flax Seed Oil 1000 Mg  Caps (Flaxseed (Linseed)) .... Take 1 Tablet By Mouth Once A Day 10)  Vitamin B-12 100 Mcg  Tabs (Cyanocobalamin) .... Take 1 Tablet By Mouth Once A Day 11)  Uroxatral 10 Mg Xr24h-Tab (Alfuzosin Hcl) .... Take One Tab Daily 12)  Vitamin D .... Once Daily 13)  Tumeric .... Once Daily 14)  Cranberry Caps (Cranberry) .... Once Daily 15)  Clonidine Hcl 0.1 Mg Tabs (Clonidine Hcl) .... Take 1/2 Tablet If Sbp Between 160-180 and Take 1 Tablet If Sbp >180 16)  Warfarin Sodium 5 Mg Tabs (Warfarin Sodium) .... Use As Directed By Anticoagulation Clinic 17)  Coq10  50 Mg Caps (Coenzyme Q10) .Marland Kitchen.. 1 By Mouth Daily  Allergies (verified): No Known Drug Allergies  Referring Provider:  Berton Mount Primary Provider:  Jacques Navy MD   History of Present Illness: The patient is 65 years old and return for management of CAD. He also has paroxysmal atrial fibrillation. He had a cardiac catheterization done in 2007 at which time he had nonobstructive coronary disease. He had radiofrequency ablation for atrial fibrillation in March of 2009 and after that he had a followup CT scan to look for pulmonary vein stenosis. This documented s fonary atherosclerosis and is referred back to [place] for further evaluation. We did a Myoview scan which showed no evidence of ischemia. He now returns for a followup visit.  He had a repeat ablation in May of 2010 4 mild recurrence of atrial fibrillation. He has been quite active with walking both outside on the treadmill. He also works out with [employer]. He has had some burning substernal pain but it has not been frequent.  His lipid profile was last evaluated in September 2010 at which time his cholesterol was 117, his HDL was 52, and his LDL was 58.  He does have a history of TIA so he would be [place] score 3.  Anticoagulation Management History:      The patient is taking warfarin and comes in today for a routine follow up visit.  Negative risk factors for bleeding include an age less than 78 years old.  The bleeding index is 'low risk'.  Positive CHADS2 values include History of HTN.  Negative CHADS2 values include Age > 103 years old.  The start date was 10/26/1999.  His last INR was 1.6 RATIO.  Anticoagulation responsible provider: Excell Seltzer MD, Casimiro Needle.  INR POC: 2.4.  Cuvette Lot#: [id number].  Exp: 10/2010.     Past History:  Past Medical History: Reviewed history from 03/10/2009 and no changes required. 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    Physical Exam  Additional Exam:  Gen. Well-nourished, in no distress   Neck: No JVD, thyroid not enlarged, no carotid bruits Lungs: No tachypnea, clear without rales, rhonchi or wheezes Cardiovascular: Rhythm regular, PMI not displaced,  heart sounds  normal, no murmurs or gallops, no peripheral edema, pulses normal in all 4 extremities. Abdomen: BS normal, abdomen soft and non-tender without masses or organomegaly, no hepatosplenomegaly. MS: No deformities, no cyanosis or clubbing   Neuro:  No focal sns   Skin:  no lesions    Impression & Recommendations:  Problem # 1:  CORONARY ARTERY DISEASE (ICD-414.00) He has documented CAD both by catheterization which was nonobstructive and by CT and she'll. He does have occasional burning substernal chest pain. We will plan to evaluate him with a rest stress Myoview scan.  Because of the extensive plaque burden that he has by CT angiography, our current plan is to perform followup stress perfusion studies yearly. We will plan to have him follow up with Dr. Graciela Husbands in 6 months and have Dr. Graciela Husbands make final decisions regarding these plans. His updated medication list for this problem includes:    Altace 10 Mg Caps (Ramipril) .Marland Kitchen... Take 1 tablet by mouth once a day    Adult Aspirin Ec Low Strength 81 Mg Tbec (Aspirin) .Marland Kitchen... Take 1 tablet by mouth once a day    Warfarin Sodium 5 Mg Tabs (Warfarin sodium) ..... Use as directed by anticoagulation clinic  Problem # 2:  HYPERTENSION (ICD-401.9) A. pressures under good control and current medications. His updated medication list for this  problem includes:    Altace 10 Mg Caps (Ramipril) .Marland Kitchen... Take 1 tablet by mouth once a day    Adult Aspirin Ec Low Strength 81 Mg Tbec (Aspirin) .Marland Kitchen... Take 1 tablet by mouth once a day    Clonidine Hcl 0.1 Mg Tabs (Clonidine hcl) .Marland Kitchen... Take 1/2 tablet if sbp between 160-180 and take 1 tablet if sbp >180  Problem # 3:  ATRIAL FIBRILLATION (ICD-427.31) He is status post radiofrequency ablation for atrial fibrillation twice. He's had minimal symptoms and is on no rhythm control medication. He is on Coumadin and he is [place] score of 3. His updated medication list for this problem includes:    Adult Aspirin Ec Low Strength 81 Mg Tbec (Aspirin) .Marland Kitchen... Take 1 tablet by mouth once a day    Warfarin Sodium 5 Mg Tabs (Warfarin sodium) .Marland KitchenMarland KitchenMarland KitchenMarland Kitchen  Use as directed by anticoagulation clinic  Problem # 4:  HYPERLIPIDEMIA-MIXED (ICD-272.4) His last lipid profile was excellent. His updated medication list for this problem includes:    Lipitor 20 Mg Tabs (Atorvastatin calcium) .Marland Kitchen... Take 1 tablet by mouth once a day  Anticoagulation Management Assessment/Plan:      The patient's current anticoagulation dose is Warfarin sodium 5 mg tabs: Use as directed by Anticoagulation Clinic.  The target INR is 2.0-3.0.  The next INR is due 11/25/2009.  Anticoagulation instructions were given to patient.  Results were reviewed/authorized by Louie Casa, PharmD.         Prior Anticoagulation Instructions: INR 2.3  Continue same dose of 1.5 tablets daily except 2 tablets Mondays, Wednesdays, and Fridays. Recheck in 4 weeks.  Current Anticoagulation Instructions: INR 2.4  CONTINUE TO ALTERNATE 2 TABS AND 1.5 TABS EVERY OTHER DAY.  RECHECK IN 4 WEEKS.

## 2010-10-24 NOTE — Medication Information (Signed)
Summary: rov couamdin - [facility]   Anticoagulant Therapy  Managed by: Weston Brass, PharmD Referring MD: Sherryl Manges, MD PCP: Jacques Navy MD Supervising MD: Daleen Squibb MD, Maisie Fus Indication 1: Atrial Flutter (ICD-427.32) Indication 2: AMIODARONE (ICD-amio) Lab Used: [facility] Site: [facility] INR POC 1.9 INR RANGE 2 - 3  Dietary changes: no    Health status changes: no    Bleeding/hemorrhagic complications: no    Recent/future hospitalizations: no    Any changes in medication regimen? no    Recent/future dental: no  Any missed doses?: no       Is patient compliant with meds? yes       Allergies: No Known Drug Allergies  Anticoagulation Management History:      The patient is taking warfarin and comes in today for a routine follow up visit.  Negative risk factors for bleeding include an age less than 70 years old.  The bleeding index is 'low risk'.  Positive CHADS2 values include History of HTN.  Negative CHADS2 values include Age > 19 years old.  The start date was 10/26/1999.  His last INR was 1.6 RATIO.  Anticoagulation responsible provider: Daleen Squibb MD, Maisie Fus.  INR POC: 1.9.  Cuvette Lot#: [id number].  Exp: 04/2011.    Anticoagulation Management Assessment/Plan:      The patient's current anticoagulation dose is Warfarin sodium 5 mg tabs: Use as directed by Anticoagulation Clinic.  The target INR is 2.0-3.0.  The next INR is due 03/16/2010.  Anticoagulation instructions were given to patient.  Results were reviewed/authorized by Weston Brass, PharmD.  He was notified by Ledon Snare, RN.         Prior Anticoagulation Instructions: INR 2.5  Couamdin 2 tabs = 10mg  Mon, Wed, Fri 1 and 1/2 tab Tu, Thur, Sat, Sun  Current Anticoagulation Instructions: INR 1.9  Take an extra 1/2 tablet today then resume same dose of 1 1/2 tablets every day except 2 tablets on Monday, Wednesday and Friday

## 2010-10-24 NOTE — Progress Notes (Signed)
Summary: Nuc Pre-Procedure  Phone Note Outgoing Call Call back at [facility] Phone [phone]   Call placed by: Antionette Char RN,  June 22, 2010 11:25 AM Call placed to: Patient Reason for Call: Confirm/change Appt Summary of Call: Left message with information on Myoview Information Sheet (see scanned document for details).     Nuclear Med Background Indications for Stress Test: Evaluation for Ischemia   History: Ablation, COPD, CT/MRI, Heart Catheterization, Myocardial Perfusion Study, MVP  History Comments: 03/07 Cath:N/O CAD; '07 Plaque; 03/09 Ablation (Afib); 05/10 Repeat Ablation; 02/10 QIO:NGEXBM,    Symptoms: Chest Pain with Exertion, Palpitations    Nuclear Pre-Procedure Cardiac Risk Factors: Family History - CAD, Hypertension, Lipids Height (in): 79  Nuclear Med Study Referring MD:  Sherryl Manges, MD

## 2010-10-24 NOTE — Assessment & Plan Note (Signed)
Summary: Cardiology Nuclear Testing  Nuclear Med Background Indications for Stress Test: Evaluation for Ischemia   History: Ablation, COPD, CT/MRI, Heart Catheterization, Myocardial Perfusion Study, MVP  History Comments: 03/07 Cath:N/O CAD; '07 Plaque; 03/09 Ablation (Afib); 05/10 Repeat Ablation; 02/10 ZOX:WRUEAV,    Symptoms: Chest Pain with Exertion, Palpitations    Nuclear Pre-Procedure Cardiac Risk Factors: Family History - CAD, Hypertension, Lipids Caffeine/Decaff Intake: none NPO After: 8:00 AM Lungs: clear IV 0.9% NS with Angio Cath: 22g     IV Site: R Hand Chest Size (in) 46-48     Height (in): 79 Weight (lb): 225 BMI: 25.44  Nuclear Med Study 1 or 2 day study:  1 day     Stress Test Type:  Stress Reading MD:  Kristeen Miss, MD     Referring MD:  Sherryl Manges, MD Resting Radionuclide:  Technetium 23m Tetrofosmin     Resting Radionuclide Dose:  11 mCi  Stress Radionuclide:  Technetium 67m Tetrofosmin     Stress Radionuclide Dose:  33 mCi   Stress Protocol Exercise Time (min):  12:20 min     Max HR:  166 bpm     Predicted Max HR:  156 bpm  Max Systolic BP: 167 mm Hg     Percent Max HR:  106.41 %     METS: 13.9 Rate Pressure Product:  40981    Stress Test Technologist:  Milana Na, EMT-P     Nuclear Technologist:  Doyne Keel, CNMT  Rest Procedure  Myocardial perfusion imaging was performed at rest 45 minutes following the intravenous administration of Technetium 72m Tetrofosmin.  Stress Procedure  The patient exercised for 12:20.  The patient stopped due to fatigue and denied any chest pain.  There were no significant ST-T wave changes and + occ pacs.  Technetium 23m Tetrofosmin was injected at peak exercise and myocardial perfusion imaging was performed after a brief delay.  QPS Raw Data Images:  Normal; no motion artifact; normal heart/lung ratio. Stress Images:  Normal homogeneous uptake in all areas of the myocardium. Rest Images:  Normal homogeneous  uptake in all areas of the myocardium. Subtraction (SDS):  Normal Transient Ischemic Dilatation:  1.05  (Normal <1.22)  Lung/Heart Ratio:  0.34  (Normal <0.45)  Quantitative Gated Spect Images QGS EDV:  144 ml QGS ESV:  56 ml QGS EF:  61 % QGS cine images:  The LV appears to mildly enlarged .  The LV systolic function is normal.  Findings Normal nuclear study      Overall Impression  Exercise Capacity: Excellent exercise capacity. BP Response: Normal blood pressure response. Clinical Symptoms: No chest pain ECG Impression: No significant ST segment change suggestive of ischemia. Overall Impression: Normal stress nuclear study. Overall Impression Comments: Normal stress nuclear study.  Appended Document: Cardiology Nuclear Testing left msg on pt identified v/m of normal results. Claris Gladden, RN, BSN

## 2010-10-24 NOTE — Medication Information (Signed)
Summary: rov/sp  Anticoagulant Therapy  Managed by: Bethena Midget, RN, BSN Referring MD: Sherryl Manges, MD PCP: Jacques Navy MD Supervising MD: Shirlee Latch MD, Dalton Indication 1: Atrial Flutter (ICD-427.32) Indication 2: AMIODARONE (ICD-amio) Lab Used: [facility] Site: [facility] INR POC 2.1 INR RANGE 2 - 3  Dietary changes: no    Health status changes: no    Bleeding/hemorrhagic complications: no    Recent/future hospitalizations: no    Any changes in medication regimen? no    Recent/future dental: no  Any missed doses?: no       Is patient compliant with meds? yes       Allergies: No Known Drug Allergies  Anticoagulation Management History:      The patient is taking warfarin and comes in today for a routine follow up visit.  Negative risk factors for bleeding include an age less than 76 years old.  The bleeding index is 'low risk'.  Positive CHADS2 values include History of HTN.  Negative CHADS2 values include Age > 24 years old.  The start date was 10/26/1999.  His last INR was 1.6 RATIO.  Anticoagulation responsible provider: Shirlee Latch MD, Dalton.  INR POC: 2.1.  Cuvette Lot#: [id number].  Exp: 07/2011.    Anticoagulation Management Assessment/Plan:      The patient's current anticoagulation dose is Warfarin sodium 5 mg tabs: Use as directed by Anticoagulation Clinic.  The target INR is 2.0-3.0.  The next INR is due 07/13/2010.  Anticoagulation instructions were given to patient.  Results were reviewed/authorized by Bethena Midget, RN, BSN.  He was notified by Bethena Midget, RN, BSN.         Prior Anticoagulation Instructions: INR 2.5  Continue same dose of 1 1/2 tablets every day except 2 tablets on Monday, Wednesday and Friday.  Recheck INR in 4 weeks.   Current Anticoagulation Instructions: INR 2.1 Continue 7.5mg s daily except 10mg s on Mondays, Wednesdays and Fridays. Recheck in 4 weeks.

## 2010-10-24 NOTE — Medication Information (Signed)
Summary: rov coumadin - [facility]  Anticoagulant Therapy  Managed by: Leota Sauers, PharmD, BCPS, CPP Referring MD: Sherryl Manges, MD PCP: Jacques Navy MD Supervising MD: Daleen Squibb MD, Maisie Fus Indication 1: Atrial Flutter (ICD-427.32) Indication 2: AMIODARONE (ICD-amio) Lab Used: [facility] Site: [facility] INR POC 2.5 INR RANGE 2 - 3  Dietary changes: no    Health status changes: no    Bleeding/hemorrhagic complications: no    Recent/future hospitalizations: no    Any changes in medication regimen? no    Recent/future dental: no  Any missed doses?: no       Is patient compliant with meds? yes       Current Medications (verified): 1)  Altace 10 Mg Caps (Ramipril) .... Take 1 Tablet By Mouth Once A Day 2)  Lipitor 20 Mg Tabs (Atorvastatin Calcium) .... Take 1 Tablet By Mouth Once A Day 3)  Proscar 5 Mg Tabs (Finasteride) .... Take 1 Tablet By Mouth Once A Day 4)  Adult Aspirin Ec Low Strength 81 Mg  Tbec (Aspirin) .... Take 1 Tablet By Mouth Once A Day 5)  Multivitamins   Tabs (Multiple Vitamin) .... Take 1 Tablet By Mouth Once A Day 6)  Oscal 500/200 D-3 500-200 Mg-Unit  Tabs (Calcium-Vitamin D) .... Take 2 in Am 1 in Pm 7)  Fish Oil 1200 Mg  Caps (Omega-3 Fatty Acids) .... Take 1 Tablet By Mouth Once A Day 8)  Cod Liver Oil   Caps (Cod Liver Oil) .... Take 1 Tablet By Mouth Once A Day 9)  Flax Seed Oil 1000 Mg  Caps (Flaxseed (Linseed)) .... Take 1 Tablet By Mouth Once A Day 10)  Vitamin B-12 100 Mcg  Tabs (Cyanocobalamin) .... Take 1 Tablet By Mouth Once A Day 11)  Uroxatral 10 Mg Xr24h-Tab (Alfuzosin Hcl) .... Take One Tab Daily 12)  Vitamin D .... Once Daily 13)  Tumeric .... Once Daily 14)  Cranberry Caps (Cranberry) .... Once Daily 15)  Clonidine Hcl 0.1 Mg Tabs (Clonidine Hcl) .... Take 1/2 Tablet If Sbp Between 160-180 and Take 1 Tablet If Sbp >180 16)  Warfarin Sodium 5 Mg Tabs (Warfarin Sodium) .... Use As Directed By Anticoagulation Clinic 17)  Coq10 50 Mg Caps  (Coenzyme Q10) .Marland Kitchen.. 1 By Mouth Daily  Allergies (verified): No Known Drug Allergies  Anticoagulation Management History:      The patient is taking warfarin and comes in today for a routine follow up visit.  Negative risk factors for bleeding include an age less than 35 years old.  The bleeding index is 'low risk'.  Positive CHADS2 values include History of HTN.  Negative CHADS2 values include Age > 50 years old.  The start date was 10/26/1999.  His last INR was 1.6 RATIO.  Anticoagulation responsible provider: Daleen Squibb MD, Maisie Fus.  INR POC: 2.5.  Exp: 01/2011.    Anticoagulation Management Assessment/Plan:      The patient's current anticoagulation dose is Warfarin sodium 5 mg tabs: Use as directed by Anticoagulation Clinic.  The target INR is 2.0-3.0.  The next INR is due 02/16/2010.  Anticoagulation instructions were given to patient.  Results were reviewed/authorized by Leota Sauers, PharmD, BCPS, CPP.         Prior Anticoagulation Instructions: INR 2.0  Coumadin 2 tabs = 10mg  on Mon, Wed, Fri 1 and 1/2 tab = 7.5mg  all other days  Current Anticoagulation Instructions: INR 2.5  Couamdin 2 tabs = 10mg  Mon, Wed, Fri 1 and 1/2 tab Tu, Thur, Sat,  Ayline Fetter

## 2010-10-24 NOTE — Medication Information (Signed)
Summary: COUMADIN F/U SL  Anticoagulant Therapy  Managed by: Weston Brass, PharmD Referring MD: Sherryl Manges, MD PCP: Jacques Navy MD Supervising MD: Tenny Craw MD, Gunnar Fusi Indication 1: Atrial Flutter (ICD-427.32) Indication 2: AMIODARONE (ICD-amio) Lab Used: [facility] Site: [facility] INR POC 2.5 INR RANGE 2 - 3  Dietary changes: no    Health status changes: no    Bleeding/hemorrhagic complications: no    Recent/future hospitalizations: no    Any changes in medication regimen? no    Recent/future dental: no  Any missed doses?: no       Is patient compliant with meds? yes       Allergies: No Known Drug Allergies  Anticoagulation Management History:      The patient is taking warfarin and comes in today for a routine follow up visit.  Negative risk factors for bleeding include an age less than 4 years old.  The bleeding index is 'low risk'.  Positive CHADS2 values include History of HTN.  Negative CHADS2 values include Age > 12 years old.  The start date was 10/26/1999.  His last INR was 1.6 RATIO.  Anticoagulation responsible provider: Tenny Craw MD, Gunnar Fusi.  INR POC: 2.5.  Cuvette Lot#: [id number].  Exp: 06/2011.    Anticoagulation Management Assessment/Plan:      The patient's current anticoagulation dose is Warfarin sodium 5 mg tabs: Use as directed by Anticoagulation Clinic.  The target INR is 2.0-3.0.  The next INR is due 06/15/2010.  Anticoagulation instructions were given to patient.  Results were reviewed/authorized by Weston Brass, PharmD.  He was notified by Weston Brass PharmD.         Prior Anticoagulation Instructions: INR 2.2 Continue 7.5mg s everyday except 10mg s on Mondays, Wednesdays and Fridays. Recheck in 4 weeks.   Current Anticoagulation Instructions: INR 2.5  Continue same dose of 1 1/2 tablets every day except 2 tablets on Monday, Wednesday and Friday.  Recheck INR in 4 weeks.

## 2010-10-24 NOTE — Assessment & Plan Note (Signed)
Summary: CPX   Vital Signs:  Patient profile:   65 year old male Height:      79 inches Weight:      228 pounds BMI:     25.78 O2 Sat:      96 % on Room air Temp:     97.7 degrees F oral Pulse rate:   85 / minute Pulse rhythm:   regular BP sitting:   110 / 62  Vitals Entered By: Bill Salinas CMA, October 4th 2011 10:23am  O2 Flow:  Room air  Primary Care Provider:  Jacques Navy MD   History of Present Illness: [name] [name] presents for routine medical follow-up. In the interval since his last visit he has been doing  well.  He has had follow-up with Dr. Graciela Husbands - he has been holding sinus rhythm, although he feels at times that he is on the verge of a. fib. October 3rd he had a nuclear stress test that was read out as normal (study reviewed). He has been very active without limitation. He has not been troubled by orthostasis due to vagal issues.  He saw Dr. Patsi Sears September 30th with a normal exam (note reviewed) with a 3+ prostate.  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 Fib '09 - Dr. Delena Serve, [facility]. x 2 Cataract Extraction  Family History: Last updated: 06/23/2009 Father - h/o CAD/MI, GIST Mother - h/o TIA, carotid artery disease Aunt & Uncle  - colon cancer.  Social History: [name] [name]; [name] school Married '79 2 daughters - '80, '82. 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  Alcohol Use - yes Daily Caffeine Use  Review of Systems  The patient denies anorexia, fever, weight loss, weight gain, vision loss, decreased hearing, chest pain, syncope, dyspnea on exertion, peripheral edema, headaches, hemoptysis, abdominal pain, severe indigestion/heartburn, muscle weakness, difficulty walking, depression, abnormal bleeding, and enlarged lymph nodes.    Physical Exam  General:  alert, well-developed, well-nourished, well-hydrated, and healthy-appearing.   Head:  normocephalic, atraumatic, and no abnormalities observed.   Eyes:  vision grossly intact, pupils equal, pupils round, corneas and lenses clear, and no injection.   Ears:  External ear exam shows no significant lesions or deformities.  Otoscopic examination reveals clear canals, tympanic membranes are intact bilaterally without bulging, retraction, inflammation or discharge. Hearing is grossly normal bilaterally. Nose:  no external deformity and no external erythema.   Mouth:  Oral mucosa and oropharynx without lesions or exudates.  Teeth in good repair. Neck:  supple, full ROM, no thyromegaly, and no carotid bruits.   Chest Wall:  no deformities.   Lungs:  Normal respiratory effort, chest expands symmetrically. Lungs are clear to auscultation, no crackles or wheezes. Heart:  Normal rate and regular rhythm. S1 and S2 normal without gallop, murmur, click, rub or other extra sounds. Abdomen:  soft, non-tender, normal bowel sounds, no guarding, and no hepatomegaly.   Prostate:  deferred to Dr. Patsi Sears Msk:  normal ROM, no joint tenderness, no joint  swelling, no joint warmth, and no redness over joints.   Pulses:  2+ radial and DP pulses Extremities:  No clubbing, cyanosis, edema, or deformity noted with normal full range of motion of all joints.   Neurologic:  alert & oriented X3, cranial nerves II-XII intact, strength normal in all  extremities, sensation intact to pinprick, and DTRs symmetrical and normal.   Skin:  turgor normal, color normal, no rashes, no suspicious lesions, and no ulcerations.   Cervical Nodes:  No lymphadenopathy noted Psych:  Oriented X3, normally interactive, good eye contact, and not anxious appearing.     Impression & Recommendations:  Problem # 1:  HYPERLIPIDEMIA-MIXED (ICD-272.4)  His updated medication list for this problem includes:    Lipitor 20 Mg Tabs (Atorvastatin calcium) .Marland Kitchen... Take 1 tablet by mouth once a day  Labs Reviewed: SGOT: 25 (06/13/2010)   SGPT: 19 (06/13/2010)   HDL:49.00 (06/13/2010), 51.90 (06/20/2009)  LDL:68 (06/13/2010), 58 (06/20/2009)  Chol:124 (06/13/2010), 117 (06/20/2009)  Trig:36.0 (06/13/2010), 35.0 (06/20/2009)  Excellent control on present regimen.  Problem # 2:  SLEEP APNEA, OBSTRUCTIVE, MILD (ICD-327.23) Stable  Problem # 3:  BENIGN PROSTATIC HYPERTROPHY, HX OF (ICD-V13.8) Current with his urologist with no obstructive symptoms reported.  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  Problem # 5:  HYPERTENSION (ICD-401.9)  His updated medication list for this problem includes:    Altace 10 Mg Caps (Ramipril) .Marland Kitchen... Take 1 tablet by mouth once a day    Clonidine Hcl 0.1 Mg Tabs (Clonidine hcl) .Marland Kitchen... Take 1/2 tablet if sbp between 160-180 and take 1 tablet if sbp >180  BP today: 110/62 Prior BP: 110/62 (06/27/2010)  Labs Reviewed: K+: 4.3 (06/13/2010) Creat: : 0.8 (06/13/2010)     Good control.  Plan - continue present medications.  Problem # 6:  CORONARY ARTERY DISEASE (ICD-414.00) Recent stress test that was normal. No further evaluation planned at this time except for routine follow- up with Dr. Graciela Husbands  His updated medication list for this problem includes:    Altace 10 Mg Caps (Ramipril) .Marland Kitchen... Take 1 tablet by mouth once a day    Adult  Aspirin Ec Low Strength 81 Mg Tbec (Aspirin) .Marland Kitchen... Take 1 tablet by mouth once a day    Clonidine Hcl 0.1 Mg Tabs (Clonidine hcl) .Marland Kitchen... Take 1/2 tablet if sbp between 160-180 and take 1 tablet if sbp >180  Problem # 7:  Preventive Health Care (ICD-V70.0) Exam today is unremarkable. Lab results are within normal limits. Current with colorectal cancer screening with last exam Dec '10. Immunizations - Tetnus Jan '09, Shingles vaccine July '07, pneumonia and flu vaccines given today. Testosterone level check in regard to osteoporosis issues and male wellness - normal  In summary - a very nice man who is medically stable and current with preventive care. He will return as needed or 1 year.   Complete Medication List: 1)  Altace 10 Mg Caps (Ramipril) .... Take 1 tablet by mouth once a day 2)  Lipitor 20 Mg Tabs (Atorvastatin calcium) .... Take 1 tablet by mouth once a day 3)  Proscar 5 Mg Tabs (Finasteride) .... Take 1 tablet by mouth once a day 4)  Adult Aspirin Ec Low Strength 81 Mg Tbec (Aspirin) .... Take 1 tablet by mouth once a day 5)  Multivitamins Tabs (Multiple vitamin) .... Take 1 tablet by mouth once a day 6)  Oscal 500/200  D-3 500-200 Mg-unit Tabs (Calcium-vitamin d) .... Take 2 in am 1 in pm 7)  Fish Oil 1200 Mg Caps (Omega-3 fatty acids) .... Take 1 tablet by mouth once a day 8)  Cod Liver Oil Caps (Cod liver oil) .... Take 1 tablet by mouth once a day 9)  Flax Seed Oil 1000 Mg Caps (Flaxseed (linseed)) .... Take 1 tablet by mouth once a day 10)  Vitamin B-12 100 Mcg Tabs (Cyanocobalamin) .... Take 1 tablet by mouth once a day 11)  Uroxatral 10 Mg Xr24h-tab (Alfuzosin hcl) .... Take one tab daily 12)  Vitamin D  .... Once daily 13)  Tumeric  .... Once daily 14)  Cranberry Caps (cranberry)  .... Once daily 15)  Clonidine Hcl 0.1 Mg Tabs (Clonidine hcl) .... Take 1/2 tablet if sbp between 160-180 and take 1 tablet if sbp >180 16)  Warfarin Sodium 5 Mg Tabs (Warfarin sodium) .... Use as  directed by anticoagulation clinic 17)  Coq10 50 Mg Caps (Coenzyme q10) .Marland Kitchen.. 1 by mouth daily   Patient: [name] [name] Note: All result statuses are Final unless otherwise noted.  Tests: (1) BMP (METABOL)   Sodium                    140 mEq/L                   135-145   Potassium                 4.3 mEq/L                   3.5-5.1   Chloride                  105 mEq/L                   96-112   Carbon Dioxide            26 mEq/L                    19-32   Glucose                   89 mg/dL                    09-81   BUN                       20 mg/dL                    1-91   Creatinine                0.8 mg/dL                   4.7-8.2   Calcium                   9.0 mg/dL                   9.5-62.1   GFR                       101.81 mL/min               >60  Tests: (2) CBC Platelet w/Diff (CBCD)   White Cell Count     [L]  2.9 K/uL  4.5-10.5   Red Cell Count            4.90 Mil/uL                 4.22-5.81   Hemoglobin                15.5 g/dL                   14.7-82.9   Hematocrit                44.6 %                      39.0-52.0   MCV                       91.1 fl                     78.0-100.0   MCHC                      34.7 g/dL                   56.2-13.0   RDW                       14.0 %                      11.5-14.6   Platelet Count            174.0 K/uL                  150.0-400.0   Neutrophil %              55.3 %                      43.0-77.0   Lymphocyte %              28.7 %                      12.0-46.0   Monocyte %                10.5 %                      3.0-12.0   Eosinophils%              4.4 %                       0.0-5.0   Basophils %               1.1 %                       0.0-3.0   Neutrophill Absolute      1.6 K/uL                    1.4-7.7   Lymphocyte Absolute       0.8 K/uL                    0.7-4.0   Monocyte Absolute         0.3 K/uL                    0.1-1.0  Eosinophils, Absolute                             0.1  K/uL                    0.0-0.7   Basophils Absolute        0.0 K/uL                    0.0-0.1  Tests: (3) Hepatic/Liver Function Panel (HEPATIC)   Total Bilirubin           1.0 mg/dL                   1.6-1.0   Direct Bilirubin          0.2 mg/dL                   9.6-0.4   Alkaline Phosphatase      47 U/L                      39-117   AST                       25 U/L                      0-37   ALT                       19 U/L                      0-53   Total Protein             6.5 g/dL                    5.4-0.9   Albumin                   4.2 g/dL                    8.1-1.9  Tests: (4) TSH (TSH)   FastTSH                   1.52 uIU/mL                 0.35-5.50  Tests: (5) Lipid Panel (LIPID)   Cholesterol               124 mg/dL                   1-478     ATP III Classification            Desirable:  < 200 mg/dL                    Borderline High:  200 - 239 mg/dL               High:  > = 240 mg/dL   Triglycerides             36.0 mg/dL                  2.9-562.1     Normal:  <150 mg/dL     Borderline High:  308 - 199 mg/dL   HDL  49.00 mg/dL                 >82.99   VLDL Cholesterol          7.2 mg/dL                   3.7-16.9   LDL Cholesterol           68 mg/dL                    6-78  CHO/HDL Ratio:  CHD Risk                             3                    Men          Women     1/2 Average Risk     3.4          3.3     Average Risk          5.0          4.4     2X Average Risk          9.6          7.1     3X Average Risk          15.0          11.0                           Tests: (6) UDip Only (UDIP)   Color                     LT. YELLOW       RANGE:  Yellow;Lt. Yellow   Clarity                   CLEAR                       Clear   Specific Gravity          1.015                       1.000 - 1.030   Urine Ph                  7.5                         5.0-8.0   Protein                   NEGATIVE                    Negative   Urine  Glucose             NEGATIVE                    Negative   Ketones                   NEGATIVE                    Negative   Urine Bilirubin           NEGATIVE  Negative   Blood                     NEGATIVE                    Negative   Urobilinogen              0.2                         0.0 - 1.0   Leukocyte Esterace        NEGATIVE                    Negative   Nitrite                   NEGATIVE                    Negative  Tests: (7) Prostate Specific Antigen (PSA)   PSA-Hyb                   0.33 ng/mL                  0.10-4.00  Tests: (1) Testosterone, Total (TESTO)   Testosterone              459.11 ng/dL                604.54-098.11

## 2010-10-24 NOTE — Progress Notes (Signed)
Summary: Nuclear Pre-Procedure  Phone Note Outgoing Call   Call placed by: Milana Na, EMT-P,  November 07, 2009 3:29 PM Summary of Call: Left message with information on Myoview Information Sheet (see scanned document for details).      Nuclear Med Background Indications for Stress Test: Evaluation for Ischemia   History: Ablation, COPD, CT/MRI, Heart Catheterization, Myocardial Perfusion Study, MVP  History Comments: 03/07 Heart Cath No CAD '07 CT/MRI Extensive Plaque 03/09 Ablation (Afib) 01/2009 Repeat Ablation '02 2010 MPS EF 62%   Symptoms: Chest Pain    Nuclear Pre-Procedure Cardiac Risk Factors: Family History - CAD, Hypertension, Lipids, TIA Height (in): 79  Nuclear Med Study Referring MD:  Sherryl Manges MD

## 2010-10-24 NOTE — Assessment & Plan Note (Signed)
Summary: 6 month rov/sl      Allergies Added: NKDA  Visit Type:  6mov Referring Provider:  Berton Mount Primary Provider:  Jacques Navy MD  CC:  no complaints.  History of Present Illness: Mr Lederman is sen infollowup for  paroxysmal atrial fibrillation. . He had radiofrequency ablation for atrial fibrillation in March of 2009 and after that he had a followup CT scan to look for pulmonary vein stenosis. He had a repeat ablation in May of 2010 4 mild recurrence of atrial fibrillation    . He has been quite active with walking both outside on the treadmill. He also works out with [employer]. He has had some predictable  substernal burning in his morning walking group;    His lipid profile was last evaluated in September 2010 at which time his cholesterol was 117, his HDL was 52, and his LDL was 58.  He does have a history of TIA so he would be [place] score 3.  He had a cardiac catheterization done in 2007 at which time he had nonobstructive coronary disease  Current Medications (verified): 1)  Altace 10 Mg Caps (Ramipril) .... Take 1 Tablet By Mouth Once A Day 2)  Lipitor 20 Mg Tabs (Atorvastatin Calcium) .... Take 1 Tablet By Mouth Once A Day 3)  Proscar 5 Mg Tabs (Finasteride) .... Take 1 Tablet By Mouth Once A Day 4)  Adult Aspirin Ec Low Strength 81 Mg  Tbec (Aspirin) .... Take 1 Tablet By Mouth Once A Day 5)  Multivitamins   Tabs (Multiple Vitamin) .... Take 1 Tablet By Mouth Once A Day 6)  Oscal 500/200 D-3 500-200 Mg-Unit  Tabs (Calcium-Vitamin D) .... Take 2 in Am 1 in Pm 7)  Fish Oil 1200 Mg  Caps (Omega-3 Fatty Acids) .... Take 1 Tablet By Mouth Once A Day 8)  Cod Liver Oil   Caps (Cod Liver Oil) .... Take 1 Tablet By Mouth Once A Day 9)  Flax Seed Oil 1000 Mg  Caps (Flaxseed (Linseed)) .... Take 1 Tablet By Mouth Once A Day 10)  Vitamin B-12 100 Mcg  Tabs (Cyanocobalamin) .... Take 1 Tablet By Mouth Once A Day 11)  Uroxatral 10 Mg Xr24h-Tab (Alfuzosin Hcl) .... Take One Tab  Daily 12)  Vitamin D .... Once Daily 13)  Tumeric .... Once Daily 14)  Cranberry Caps (Cranberry) .... Once Daily 15)  Clonidine Hcl 0.1 Mg Tabs (Clonidine Hcl) .... Take 1/2 Tablet If Sbp Between 160-180 and Take 1 Tablet If Sbp >180 16)  Warfarin Sodium 5 Mg Tabs (Warfarin Sodium) .... Use As Directed By Anticoagulation Clinic 17)  Coq10 50 Mg Caps (Coenzyme Q10) .Marland Kitchen.. 1 By Mouth Daily  Allergies (verified): No Known Drug Allergies  Vital Signs:  Patient profile:   65 year old male Height:      79 inches Weight:      229.25 pounds BMI:     25.92 Pulse rate:   90 / minute BP sitting:   112 / 66  (left arm)  Vitals Entered By: Caralee Ates CMA (June 19, 2010 1:32 PM)  Physical Exam  General:  The patient was alert and oriented in no acute distress. HEENT Normal.  Neck veins were flat, carotids were brisk.  Lungs were clear.  Heart sounds were regular without murmurs or gallops.  Abdomen was soft with active bowel sounds. There is no clubbing cyanosis or edema. Skin Warm and dry    Impression & Recommendations:  Problem # 1:  CORONARY ARTERY DISEASE (ICD-414.00) he is having some more predictable chest pain,  Will undertake myoview scanning His updated medication list for this problem includes:    Altace 10 Mg Caps (Ramipril) .Marland Kitchen... Take 1 tablet by mouth once a day    Adult Aspirin Ec Low Strength 81 Mg Tbec (Aspirin) .Marland Kitchen... Take 1 tablet by mouth once a day    Warfarin Sodium 5 Mg Tabs (Warfarin sodium) ..... Use as directed by anticoagulation clinic  Problem # 2:  ATRIAL FIBRILLATION (ICD-427.31) Holding sinus rhythm following his secnd ablation   he takes coumadin and about 85-90% of times his INR are in range.  will continue and not pursuee pradaxa His updated medication list for this problem includes:    Adult Aspirin Ec Low Strength 81 Mg Tbec (Aspirin) .Marland Kitchen... Take 1 tablet by mouth once a day    Warfarin Sodium 5 Mg Tabs (Warfarin sodium) ..... Use as directed by  anticoagulation clinic  Orders: Nuclear Stress Test (Nuc Stress Test)  Problem # 3:  HYPERTENSION (ICD-401.9)  reasonalbe will controlled  continues to use clonidine prn His updated medication list for this problem includes:    Altace 10 Mg Caps (Ramipril) .Marland Kitchen... Take 1 tablet by mouth once a day    Adult Aspirin Ec Low Strength 81 Mg Tbec (Aspirin) .Marland Kitchen... Take 1 tablet by mouth once a day    Clonidine Hcl 0.1 Mg Tabs (Clonidine hcl) .Marland Kitchen... Take 1/2 tablet if sbp between 160-180 and take 1 tablet if sbp >180  His updated medication list for this problem includes:    Altace 10 Mg Caps (Ramipril) .Marland Kitchen... Take 1 tablet by mouth once a day    Adult Aspirin Ec Low Strength 81 Mg Tbec (Aspirin) .Marland Kitchen... Take 1 tablet by mouth once a day    Clonidine Hcl 0.1 Mg Tabs (Clonidine hcl) .Marland Kitchen... Take 1/2 tablet if sbp between 160-180 and take 1 tablet if sbp >180  Other Orders: EKG w/ Interpretation (93000)  Patient Instructions: 1)  Your physician recommends that you continue on your current medications as directed. Please refer to the Current Medication list given to you today. 2)  Your physician wants you to follow-up in: 6 month  You will receive a reminder letter in the mail two months in advance. If you don't receive a letter, please call our office to schedule the follow-up appointment. 3)  Your physician has requested that you have a Lexiscan myoview.  For further information please visit [email or web address]  Please follow instruction sheet, as given.

## 2010-10-24 NOTE — Letter (Signed)
Summary: CMN for CPAP Supplies/Choice  CMN for CPAP Supplies/Choice   Imported By: Sherian Rein 08/09/2010 14:33:16  _____________________________________________________________________  External Attachment:    Type:   Image     Comment:   External Document

## 2010-10-24 NOTE — Letter (Signed)
Summary: Handout Printed  Printed Handout:  - Coumadin Instructions-w/out Meds 

## 2010-10-24 NOTE — Progress Notes (Signed)
Summary: lab results  Phone Note Call from Patient   Caller: Patient Reason for Call: Lab or Test Results Summary of Call: Pt would like a copy of most recent lab results faxed to Dr. Patsi Sears of [facility] Initial call taken by: Brenton Grills MA,  June 14, 2010 2:11 PM  Follow-up for Phone Call        done Follow-up by: Lamar Sprinkles, CMA,  June 14, 2010 5:41 PM

## 2010-10-24 NOTE — Medication Information (Signed)
Summary: rov/ewj  Anticoagulant Therapy  Managed by: Cloyde Reams, RN, BSN Referring MD: Sherryl Manges MD PCP: Jacques Navy MD Supervising MD: Antoine Poche MD, Fayrene Fearing Indication 1: Atrial Flutter (ICD-427.32) Indication 2: AMIODARONE (ICD-amio) Lab Used: [facility] Site: [facility] INR POC 2.3 INR RANGE 2 - 3  Dietary changes: no    Health status changes: no    Bleeding/hemorrhagic complications: no    Recent/future hospitalizations: no    Any changes in medication regimen? no    Recent/future dental: no  Any missed doses?: no       Is patient compliant with meds? yes       Allergies: No Known Drug Allergies  Anticoagulation Management History:      The patient is taking warfarin and comes in today for a routine follow up visit.  Negative risk factors for bleeding include an age less than 15 years old.  The bleeding index is 'low risk'.  Positive CHADS2 values include History of HTN.  Negative CHADS2 values include Age > 73 years old.  The start date was 10/26/1999.  His last INR was 1.6 RATIO.  Anticoagulation responsible provider: Antoine Poche MD, Fayrene Fearing.  INR POC: 2.3.  Cuvette Lot#: [id number].  Exp: 10/2010.    Anticoagulation Management Assessment/Plan:      The patient's current anticoagulation dose is Warfarin sodium 5 mg tabs: Use as directed by Anticoagulation Clinic.  The target INR is 2.0-3.0.  The next INR is due 10/28/2009.  Anticoagulation instructions were given to patient.  Results were reviewed/authorized by Cloyde Reams, RN, BSN.  He was notified by Lew Dawes, PharmD Candidate.         Prior Anticoagulation Instructions: INR 1.5  Take 2 tablets today then resume same dosage 1.5 tablets daily except 2 tablets on Mondays, Wednesdays, and Fridays.  Recheck in 2 weeks.    Current Anticoagulation Instructions: INR 2.3  Continue same dose of 1.5 tablets daily except 2 tablets Mondays, Wednesdays, and Fridays. Recheck in 4 weeks.

## 2010-10-24 NOTE — Medication Information (Signed)
Summary: rov/sel   Anticoagulant Therapy  Managed by: Weston Brass, PharmD Referring MD: Sherryl Manges, MD PCP: Jacques Navy MD Supervising MD: Myrtis Ser MD, Tinnie Gens Indication 1: Atrial Flutter (ICD-427.32) Indication 2: AMIODARONE (ICD-amio) Lab Used: [facility] Site: [facility] INR POC 2.3 INR RANGE 2 - 3  Dietary changes: no    Health status changes: no    Bleeding/hemorrhagic complications: no    Recent/future hospitalizations: no    Any changes in medication regimen? no    Recent/future dental: no  Any missed doses?: no       Is patient compliant with meds? yes       Allergies: No Known Drug Allergies  Anticoagulation Management History:      The patient is taking warfarin and comes in today for a routine follow up visit.  Negative risk factors for bleeding include an age less than 24 years old.  The bleeding index is 'low risk'.  Positive CHADS2 values include History of HTN.  Negative CHADS2 values include Age > 4 years old.  The start date was 10/26/1999.  His last INR was 1.6 RATIO.  Anticoagulation responsible provider: Myrtis Ser MD, Tinnie Gens.  INR POC: 2.3.  Cuvette Lot#: [id number].  Exp: 07/2011.    Anticoagulation Management Assessment/Plan:      The patient's current anticoagulation dose is Warfarin sodium 5 mg tabs: Use as directed by Anticoagulation Clinic.  The target INR is 2.0-3.0.  The next INR is due 09/06/2010.  Anticoagulation instructions were given to patient.  Results were reviewed/authorized by Weston Brass, PharmD.  He was notified by Hoy Register, PharmD Candidate.         Prior Anticoagulation Instructions: INR 2.2  Continue taking 1 1/2 tablet everyday except 2 tablets on Monday, Wednesday, and Friday. Recheck in 4 weeks.   Current Anticoagulation Instructions: INR 2.3 Continue previous dose of 1.5 tablets everyday except 2 tablets Monday, Wednesday, and Friday Recheck INR in 4 weeks

## 2010-10-24 NOTE — Medication Information (Signed)
Summary: rov/tm  Anticoagulant Therapy  Managed by: Bethena Midget, RN, BSN Referring MD: Sherryl Manges, MD PCP: Jacques Navy MD Supervising MD: Gala Romney MD, Reuel Boom Indication 1: Atrial Flutter (ICD-427.32) Indication 2: AMIODARONE (ICD-amio) Lab Used: [facility] Site: [facility] INR POC 2.2 INR RANGE 2 - 3  Dietary changes: no    Health status changes: no    Bleeding/hemorrhagic complications: no    Recent/future hospitalizations: no    Any changes in medication regimen? no    Recent/future dental: no  Any missed doses?: yes     Details: Not sure if he missed a dose last week.   Is patient compliant with meds? yes       Allergies: No Known Drug Allergies  Anticoagulation Management History:      The patient is taking warfarin and comes in today for a routine follow up visit.  Negative risk factors for bleeding include an age less than 75 years old.  The bleeding index is 'low risk'.  Positive CHADS2 values include History of HTN.  Negative CHADS2 values include Age > 31 years old.  The start date was 10/26/1999.  His last INR was 1.6 RATIO.  Anticoagulation responsible provider: Cree Kunert MD, Reuel Boom.  INR POC: 2.2.  Cuvette Lot#: [id number].  Exp: 06/2011.    Anticoagulation Management Assessment/Plan:      The patient's current anticoagulation dose is Warfarin sodium 5 mg tabs: Use as directed by Anticoagulation Clinic.  The target INR is 2.0-3.0.  The next INR is due 05/18/2010.  Anticoagulation instructions were given to patient.  Results were reviewed/authorized by Bethena Midget, RN, BSN.  He was notified by Bethena Midget, RN, BSN.         Prior Anticoagulation Instructions: INR 2.3 Continue 7.5mg s daily except 10mg s on Mondays, Wednesdays, and Fridays. Recheck in 4 weeks.   Current Anticoagulation Instructions: INR 2.2 Continue 7.5mg s everyday except 10mg s on Mondays, Wednesdays and Fridays. Recheck in 4 weeks.

## 2010-10-24 NOTE — Assessment & Plan Note (Signed)
Summary: Cardiology Nuclear Study  Nuclear Med Background Indications for Stress Test: Evaluation for Ischemia   History: Ablation, COPD, CT/MRI, Heart Catheterization, Myocardial Perfusion Study, MVP  History Comments: 03/07 Cath:N/O CAD; '07 UE:[id number] Plaque; 03/09 Ablation (Afib); 05/10 Repeat Ablation; 02/10 JYN:WGNFAO,  EF 62%; h/o chronic atrial fib.; OSA  Symptoms: DOE, Fatigue, Palpitations, Rapid HR    Nuclear Pre-Procedure Cardiac Risk Factors: Family History - CAD, Hypertension, Lipids, TIA Caffeine/Decaff Intake: None NPO After: 6:30 PM Lungs: Clear IV 0.9% NS with Angio Cath: 20g     IV Site: (R) AC IV Started by: Irean Hong RN Chest Size (in) 46     Height (in): 79 Weight (lb): 217 BMI: 24.53  Nuclear Med Study 1 or 2 day study:  1 day     Stress Test Type:  Stress Reading MD:  Willa Rough, MD     Referring MD:  Sherryl Manges, MD Resting Radionuclide:  Technetium 47m Tetrofosmin     Resting Radionuclide Dose:  11.0 mCi  Stress Radionuclide:  Technetium 32m Tetrofosmin     Stress Radionuclide Dose:  33.0 mCi   Stress Protocol Exercise Time (min):  12:00 min     Max HR:  146 bpm     Predicted Max HR:  157 bpm  Max Systolic BP: 173 mm Hg     Percent Max HR:  92.99 %     METS: 13.7 Rate Pressure Product:  13086    Stress Test Technologist:  Rea College CMA-N     Nuclear Technologist:  Harlow Asa CNMT  Rest Procedure  Myocardial perfusion imaging was performed at rest 45 minutes following the intravenous administration of Myoview Technetium 55m Tetrofosmin.  Stress Procedure  The patient exercised for twelve minutes.  The patient stopped due to fatigue and denied any chest pain.  There were no diagnostic ST-T wave changes.  There were occasional PVC's with couplets and rare PAC's.  Myoview was injected at peak exercise and myocardial perfusion imaging was performed after a brief delay.  QPS Raw Data Images:  Patient motion noted; appropriate software  correction applied. Stress Images:  There is normal uptake in all areas. Rest Images:  Normal homogeneous uptake in all areas of the myocardium. Subtraction (SDS):  No evidence of ischemia. Transient Ischemic Dilatation:  .89  (Normal <1.22)  Lung/Heart Ratio:  .26  (Normal <0.45)  Quantitative Gated Spect Images QGS EDV:  121 ml QGS ESV:  41 ml QGS EF:  66 % QGS cine images:  Normal wall motion  Findings Normal nuclear study      Overall Impression  Exercise Capacity: Good exercise capacity. BP Response: Normal blood pressure response. Clinical Symptoms: No chest pain ECG Impression: No significant ST segment change suggestive of ischemia. Overall Impression: Normal stress nuclear study. Overall Impression Comments: There were scattered PVCs with stress with one couplet.  Appended Document: Cardiology Nuclear Study I left a message of the pt's results on his identified voice mail.

## 2010-10-24 NOTE — Letter (Signed)
Summary: [facility]  [facility]   Imported By: Lennie Odor 06/27/2010 15:33:57  _____________________________________________________________________  External Attachment:    Type:   Image     Comment:   External Document

## 2010-10-24 NOTE — Assessment & Plan Note (Signed)
Summary: rov      Allergies Added: NKDA  Current Medications (verified): 1)  Altace 10 Mg Caps (Ramipril) .... Take 1 Tablet By Mouth Once A Day 2)  Lipitor 20 Mg Tabs (Atorvastatin Calcium) .... Take 1 Tablet By Mouth Once A Day 3)  Proscar 5 Mg Tabs (Finasteride) .... Take 1 Tablet By Mouth Once A Day 4)  Adult Aspirin Ec Low Strength 81 Mg  Tbec (Aspirin) .... Take 1 Tablet By Mouth Once A Day 5)  Multivitamins   Tabs (Multiple Vitamin) .... Take 1 Tablet By Mouth Once A Day 6)  Oscal 500/200 D-3 500-200 Mg-Unit  Tabs (Calcium-Vitamin D) .... Take 2 in Am 1 in Pm 7)  Fish Oil 1200 Mg  Caps (Omega-3 Fatty Acids) .... Take 1 Tablet By Mouth Once A Day 8)  Cod Liver Oil   Caps (Cod Liver Oil) .... Take 1 Tablet By Mouth Once A Day 9)  Flax Seed Oil 1000 Mg  Caps (Flaxseed (Linseed)) .... Take 1 Tablet By Mouth Once A Day 10)  Vitamin B-12 100 Mcg  Tabs (Cyanocobalamin) .... Take 1 Tablet By Mouth Once A Day 11)  Uroxatral 10 Mg Xr24h-Tab (Alfuzosin Hcl) .... Take One Tab Daily 12)  Vitamin D .... Once Daily 13)  Tumeric .... Once Daily 14)  Cranberry Caps (Cranberry) .... Once Daily 15)  Clonidine Hcl 0.1 Mg Tabs (Clonidine Hcl) .... Take 1/2 Tablet If Sbp Between 160-180 and Take 1 Tablet If Sbp >180 16)  Warfarin Sodium 5 Mg Tabs (Warfarin Sodium) .... Use As Directed By Anticoagulation Clinic 17)  Coq10 50 Mg Caps (Coenzyme Q10) .Marland Kitchen.. 1 By Mouth Daily  Allergies (verified): No Known Drug Allergies  Vital Signs:  Patient profile:   65 year old male Height:      79 inches Weight:      231 pounds Pulse rate:   95 / minute Resp:     18 per minute BP sitting:   110 / 72  (right arm)  Vitals Entered By: Marrion Coy, CNA (October 31, 2009 4:14 PM)   Other Orders: Nuclear Stress Test (Nuc Stress Test)  Patient Instructions: 1)  Your physician has requested that you have an exercise stress myoview.  For further information please visit [email or web address]  Please follow  instruction sheet, as given. 2)  Your physician wants you to follow-up in:  6 months with Dr. Graciela Husbands.You will receive a reminder letter in the mail two months in advance. If you don't receive a letter, please call our office to schedule the follow-up appointment.

## 2010-10-24 NOTE — Medication Information (Signed)
Summary: rov.mp  Anticoagulant Therapy  Managed by: Leota Sauers, PharmD, BCPS, CPP Referring MD: Sherryl Manges, MD PCP: Jacques Navy MD Supervising MD: [name] MD, Sharlot Gowda Indication 1: Atrial Flutter (ICD-427.32) Indication 2: AMIODARONE (ICD-amio) Lab Used: [facility] Site: [facility] INR POC 2.0 INR RANGE 2 - 3  Dietary changes: no    Health status changes: no    Bleeding/hemorrhagic complications: no    Recent/future hospitalizations: no    Any changes in medication regimen? no    Recent/future dental: no  Any missed doses?: no       Is patient compliant with meds? yes       Current Medications (verified): 1)  Altace 10 Mg Caps (Ramipril) .... Take 1 Tablet By Mouth Once A Day 2)  Lipitor 20 Mg Tabs (Atorvastatin Calcium) .... Take 1 Tablet By Mouth Once A Day 3)  Proscar 5 Mg Tabs (Finasteride) .... Take 1 Tablet By Mouth Once A Day 4)  Adult Aspirin Ec Low Strength 81 Mg  Tbec (Aspirin) .... Take 1 Tablet By Mouth Once A Day 5)  Multivitamins   Tabs (Multiple Vitamin) .... Take 1 Tablet By Mouth Once A Day 6)  Oscal 500/200 D-3 500-200 Mg-Unit  Tabs (Calcium-Vitamin D) .... Take 2 in Am 1 in Pm 7)  Fish Oil 1200 Mg  Caps (Omega-3 Fatty Acids) .... Take 1 Tablet By Mouth Once A Day 8)  Cod Liver Oil   Caps (Cod Liver Oil) .... Take 1 Tablet By Mouth Once A Day 9)  Flax Seed Oil 1000 Mg  Caps (Flaxseed (Linseed)) .... Take 1 Tablet By Mouth Once A Day 10)  Vitamin B-12 100 Mcg  Tabs (Cyanocobalamin) .... Take 1 Tablet By Mouth Once A Day 11)  Uroxatral 10 Mg Xr24h-Tab (Alfuzosin Hcl) .... Take One Tab Daily 12)  Vitamin D .... Once Daily 13)  Tumeric .... Once Daily 14)  Cranberry Caps (Cranberry) .... Once Daily 15)  Clonidine Hcl 0.1 Mg Tabs (Clonidine Hcl) .... Take 1/2 Tablet If Sbp Between 160-180 and Take 1 Tablet If Sbp >180 16)  Warfarin Sodium 5 Mg Tabs (Warfarin Sodium) .... Use As Directed By Anticoagulation Clinic 17)  Coq10 50 Mg Caps (Coenzyme  Q10) .Marland Kitchen.. 1 By Mouth Daily  Allergies (verified): No Known Drug Allergies  Anticoagulation Management History:      The patient is taking warfarin and comes in today for a routine follow up visit.  Negative risk factors for bleeding include an age less than 35 years old.  The bleeding index is 'low risk'.  Positive CHADS2 values include History of HTN.  Negative CHADS2 values include Age > 15 years old.  The start date was 10/26/1999.  His last INR was 1.6 RATIO.  Anticoagulation responsible provider: [name] MD, Sharlot Gowda.  INR POC: 2.0.  Cuvette Lot#: [id number].  Exp: 01/2011.    Anticoagulation Management Assessment/Plan:      The patient's current anticoagulation dose is Warfarin sodium 5 mg tabs: Use as directed by Anticoagulation Clinic.  The target INR is 2.0-3.0.  The next INR is due 01/19/2010.  Anticoagulation instructions were given to patient.  Results were reviewed/authorized by Leota Sauers, PharmD, BCPS, CPP.         Prior Anticoagulation Instructions: INR 2.6  Take 2 tabs each Monday, Wednesday, Friday and 1.5 tabs on all other days.   Recheck 4 weeks.   Current Anticoagulation Instructions: INR 2.0  Coumadin 2 tabs = 10mg  on Mon, Wed, Fri 1 and 1/2  tab = 7.5mg  all other days

## 2010-10-24 NOTE — Medication Information (Signed)
Summary: rov/tm  Anticoagulant Therapy  Managed by: Bethena Midget, RN, BSN Referring MD: Sherryl Manges, MD PCP: Jacques Navy MD Supervising MD: Eden Emms MD, Theron Arista Indication 1: Atrial Flutter (ICD-427.32) Indication 2: AMIODARONE (ICD-amio) Lab Used: [facility] Site: [facility] INR POC 2.2 INR RANGE 2 - 3  Dietary changes: no    Health status changes: no    Bleeding/hemorrhagic complications: no    Recent/future hospitalizations: no    Any changes in medication regimen? no    Recent/future dental: no  Any missed doses?: no       Is patient compliant with meds? yes       Allergies: No Known Drug Allergies  Anticoagulation Management History:      The patient is taking warfarin and comes in today for a routine follow up visit.  Negative risk factors for bleeding include an age less than 56 years old.  The bleeding index is 'low risk'.  Positive CHADS2 values include History of HTN.  Negative CHADS2 values include Age > 40 years old.  The start date was 10/26/1999.  His last INR was 1.6 RATIO.  Anticoagulation responsible provider: Eden Emms MD, Theron Arista.  INR POC: 2.2.  Cuvette Lot#: [id number].  Exp: 07/2011.    Anticoagulation Management Assessment/Plan:      The patient's current anticoagulation dose is Warfarin sodium 5 mg tabs: Use as directed by Anticoagulation Clinic.  The target INR is 2.0-3.0.  The next INR is due 08/09/2010.  Anticoagulation instructions were given to patient.  Results were reviewed/authorized by Bethena Midget, RN, BSN.  He was notified by Ilean Skill D candidate.         Prior Anticoagulation Instructions: INR 2.1 Continue 7.5mg s daily except 10mg s on Mondays, Wednesdays and Fridays. Recheck in 4 weeks.   Current Anticoagulation Instructions: INR 2.2  Continue taking 1 1/2 tablet everyday except 2 tablets on Monday, Wednesday, and Friday. Recheck in 4 weeks.

## 2010-10-24 NOTE — Medication Information (Signed)
Summary: ROV/JM  Anticoagulant Therapy  Managed by: Shelby Dubin, PharmD, BCPS, CPP Referring MD: Sherryl Manges, MD PCP: Jacques Navy MD Supervising MD: Shirlee Latch MD, Freida Busman Indication 1: Atrial Flutter (ICD-427.32) Indication 2: AMIODARONE (ICD-amio) Lab Used: [facility] Site: [facility] INR POC 2.6 INR RANGE 2 - 3  Dietary changes: yes       Details: slight decrease in alcohol over past few weeks.    Health status changes: no    Bleeding/hemorrhagic complications: no    Recent/future hospitalizations: no    Any changes in medication regimen? no    Recent/future dental: no  Any missed doses?: no       Is patient compliant with meds? yes       Allergies (verified): No Known Drug Allergies  Anticoagulation Management History:      The patient is taking warfarin and comes in today for a routine follow up visit.  Negative risk factors for bleeding include an age less than 7 years old.  The bleeding index is 'low risk'.  Positive CHADS2 values include History of HTN.  Negative CHADS2 values include Age > 76 years old.  The start date was 10/26/1999.  His last INR was 1.6 RATIO.  Anticoagulation responsible provider: Shirlee Latch MD, Macaila Tahir.  INR POC: 2.6.  Cuvette Lot#: [id number].  Exp: 01/2011.    Anticoagulation Management Assessment/Plan:      The patient's current anticoagulation dose is Warfarin sodium 5 mg tabs: Use as directed by Anticoagulation Clinic.  The target INR is 2.0-3.0.  The next INR is due 12/21/2009.  Anticoagulation instructions were given to patient.  Results were reviewed/authorized by Shelby Dubin, PharmD, BCPS, CPP.  He was notified by Shelby Dubin PharmD, BCPS, CPP.         Prior Anticoagulation Instructions: INR 2.4  CONTINUE TO ALTERNATE 2 TABS AND 1.5 TABS EVERY OTHER DAY.  RECHECK IN 4 WEEKS.  Current Anticoagulation Instructions: INR 2.6  Take 2 tabs each Monday, Wednesday, Friday and 1.5 tabs on all other days.   Recheck 4 weeks.

## 2010-10-24 NOTE — Medication Information (Signed)
Summary: ccr   Anticoagulant Therapy  Managed by: Lyna Poser, PharmD Referring MD: Sherryl Manges, MD PCP: Jacques Navy MD Supervising MD: Eden Emms MD,Asalee Barrette Indication 1: Atrial Flutter (ICD-427.32) Indication 2: AMIODARONE (ICD-amio) Lab Used: [facility] Site: [facility] INR POC 2.7 INR RANGE 2 - 3  Dietary changes: no    Health status changes: yes       Details: bruises on legs wanted to get level checked sooner  Bleeding/hemorrhagic complications: no    Recent/future hospitalizations: no    Any changes in medication regimen? no    Recent/future dental: no  Any missed doses?: no       Is patient compliant with meds? yes      Comments: patient going out of town. requesting quicker INR check.  Allergies: No Known Drug Allergies  Anticoagulation Management History:      The patient is taking warfarin and comes in today for a routine follow up visit.  Negative risk factors for bleeding include an age less than 54 years old.  The bleeding index is 'low risk'.  Positive CHADS2 values include History of HTN.  Negative CHADS2 values include Age > 98 years old.  The start date was 10/26/1999.  His last INR was 1.6 RATIO.  Anticoagulation responsible provider: Eden Emms MD,Rosamund Nyland.  INR POC: 2.7.  Cuvette Lot#: [id number].  Exp: 07/2011.    Anticoagulation Management Assessment/Plan:      The patient's current anticoagulation dose is Warfarin sodium 5 mg tabs: Use as directed by Anticoagulation Clinic.  The target INR is 2.0-3.0.  The next INR is due 09/14/2010.  Anticoagulation instructions were given to patient.  Results were reviewed/authorized by Lyna Poser, PharmD.         Prior Anticoagulation Instructions: INR 2.3 Continue previous dose of 1.5 tablets everyday except 2 tablets Monday, Wednesday, and Friday Recheck INR in 4 weeks  Current Anticoagulation Instructions: INR 2.7 Continue taking 2 tablets on monday, wednesday, and friday. And 1.5 tablets all other days.  Recheck in 3 weeks.

## 2010-10-24 NOTE — Assessment & Plan Note (Signed)
Summary: CPX/ NWS  #   Vital Signs:  Patient profile:   65 year old male Height:      79 inches Weight:      228 pounds BMI:     25.78 O2 Sat:      96 % on Room air Temp:     97.7 degrees F oral Pulse rate:   85 / minute BP sitting:   110 / 62  (left arm) Cuff size:   regular  Vitals Entered By: Bill Salinas CMA (June 27, 2010 10:23 AM)  O2 Flow:  Room air CC: cpx/ ab Comments Flu shot today   CC:  cpx/ ab.  Current Medications (verified): 1)  Altace 10 Mg Caps (Ramipril) .... Take 1 Tablet By Mouth Once A Day 2)  Lipitor 20 Mg Tabs (Atorvastatin Calcium) .... Take 1 Tablet By Mouth Once A Day 3)  Proscar 5 Mg Tabs (Finasteride) .... Take 1 Tablet By Mouth Once A Day 4)  Adult Aspirin Ec Low Strength 81 Mg  Tbec (Aspirin) .... Take 1 Tablet By Mouth Once A Day 5)  Multivitamins   Tabs (Multiple Vitamin) .... Take 1 Tablet By Mouth Once A Day 6)  Oscal 500/200 D-3 500-200 Mg-Unit  Tabs (Calcium-Vitamin D) .... Take 2 in Am 1 in Pm 7)  Fish Oil 1200 Mg  Caps (Omega-3 Fatty Acids) .... Take 1 Tablet By Mouth Once A Day 8)  Cod Liver Oil   Caps (Cod Liver Oil) .... Take 1 Tablet By Mouth Once A Day 9)  Flax Seed Oil 1000 Mg  Caps (Flaxseed (Linseed)) .... Take 1 Tablet By Mouth Once A Day 10)  Vitamin B-12 100 Mcg  Tabs (Cyanocobalamin) .... Take 1 Tablet By Mouth Once A Day 11)  Uroxatral 10 Mg Xr24h-Tab (Alfuzosin Hcl) .... Take One Tab Daily 12)  Vitamin D .... Once Daily 13)  Tumeric .... Once Daily 14)  Cranberry Caps (Cranberry) .... Once Daily 15)  Clonidine Hcl 0.1 Mg Tabs (Clonidine Hcl) .... Take 1/2 Tablet If Sbp Between 160-180 and Take 1 Tablet If Sbp >180 16)  Warfarin Sodium 5 Mg Tabs (Warfarin Sodium) .... Use As Directed By Anticoagulation Clinic 17)  Coq10 50 Mg Caps (Coenzyme Q10) .Marland Kitchen.. 1 By Mouth Daily  Allergies (verified): No Known Drug Allergies   Complete Medication List: 1)  Altace 10 Mg Caps (Ramipril) .... Take 1 tablet by mouth once a day 2)   Lipitor 20 Mg Tabs (Atorvastatin calcium) .... Take 1 tablet by mouth once a day 3)  Proscar 5 Mg Tabs (Finasteride) .... Take 1 tablet by mouth once a day 4)  Adult Aspirin Ec Low Strength 81 Mg Tbec (Aspirin) .... Take 1 tablet by mouth once a day 5)  Multivitamins Tabs (Multiple vitamin) .... Take 1 tablet by mouth once a day 6)  Oscal 500/200 D-3 500-200 Mg-unit Tabs (Calcium-vitamin d) .... Take 2 in am 1 in pm 7)  Fish Oil 1200 Mg Caps (Omega-3 fatty acids) .... Take 1 tablet by mouth once a day 8)  Cod Liver Oil Caps (Cod liver oil) .... Take 1 tablet by mouth once a day 9)  Flax Seed Oil 1000 Mg Caps (Flaxseed (linseed)) .... Take 1 tablet by mouth once a day 10)  Vitamin B-12 100 Mcg Tabs (Cyanocobalamin) .... Take 1 tablet by mouth once a day 11)  Uroxatral 10 Mg Xr24h-tab (Alfuzosin hcl) .... Take one tab daily 12)  Vitamin D  .... Once daily 13)  Tumeric  .... Once daily 14)  Cranberry Caps (cranberry)  .... Once daily 15)  Clonidine Hcl 0.1 Mg Tabs (Clonidine hcl) .... Take 1/2 tablet if sbp between 160-180 and take 1 tablet if sbp >180 16)  Warfarin Sodium 5 Mg Tabs (Warfarin sodium) .... Use as directed by anticoagulation clinic 17)  Coq10 50 Mg Caps (Coenzyme q10) .Marland Kitchen.. 1 by mouth daily  Other Orders: Pneumococcal Vaccine (69629) Admin 1st Vaccine (52841) Admin 1st Vaccine (32440) Flu Vaccine 1yrs + (10272) TLB-Testosterone, Total (84403-TESTO)   Immunizations Administered:  Pneumonia Vaccine:    Vaccine Type: Pneumovax    Site: right deltoid    Mfr: Merck    Dose: 0.5 ml    Route: IM    Given by: Ami Bullins CMA    Exp. Date: 10/11/2011    Lot #: [id number]    VIS given: 08/29/09 version given June 27, 2010.      Flu Vaccine Consent Questions     Do you have a history of severe allergic reactions to this vaccine? no    Any prior history of allergic reactions to egg and/or gelatin? no    Do you have a sensitivity to the preservative Thimersol? no    Do you  have a past history of Guillan-Barre Syndrome? no    Do you currently have an acute febrile illness? no    Have you ever had a severe reaction to latex? no    Vaccine information given and explained to patient? yes    Are you currently pregnant? no    Lot Number:[id number]   Exp Date:03/24/2011   Site Given  Left Deltoid IMu

## 2010-10-24 NOTE — Medication Information (Signed)
Summary: rov/sp  Anticoagulant Therapy  Managed by: Bethena Midget, RN, BSN Referring MD: Sherryl Manges, MD PCP: Jacques Navy MD Supervising MD: Johney Frame MD, Fayrene Fearing Indication 1: Atrial Flutter (ICD-427.32) Indication 2: AMIODARONE (ICD-amio) Lab Used: [facility] Site: [facility] INR POC 2.3 INR RANGE 2 - 3  Dietary changes: no    Health status changes: no    Bleeding/hemorrhagic complications: no    Recent/future hospitalizations: no    Any changes in medication regimen? no    Recent/future dental: no  Any missed doses?: no       Is patient compliant with meds? yes       Allergies: No Known Drug Allergies  Anticoagulation Management History:      The patient is taking warfarin and comes in today for a routine follow up visit.  Negative risk factors for bleeding include an age less than 50 years old.  The bleeding index is 'low risk'.  Positive CHADS2 values include History of HTN.  Negative CHADS2 values include Age > 67 years old.  The start date was 10/26/1999.  His last INR was 1.6 RATIO.  Anticoagulation responsible provider: Birdie Fetty MD, Fayrene Fearing.  INR POC: 2.3.  Cuvette Lot#: [id number].  Exp: 04/2011.    Anticoagulation Management Assessment/Plan:      The patient's current anticoagulation dose is Warfarin sodium 5 mg tabs: Use as directed by Anticoagulation Clinic.  The target INR is 2.0-3.0.  The next INR is due 04/17/2010.  Anticoagulation instructions were given to patient.  Results were reviewed/authorized by Bethena Midget, RN, BSN.  He was notified by Bethena Midget, RN, BSN.         Prior Anticoagulation Instructions: INR 1.9  Take an extra 1/2 tablet today then resume same dose of 1 1/2 tablets every day except 2 tablets on Monday, Wednesday and Friday   Current Anticoagulation Instructions: INR 2.3 Continue 7.5mg s daily except 10mg s on Mondays, Wednesdays, and Fridays. Recheck in 4 weeks.

## 2010-10-26 NOTE — Medication Information (Signed)
Summary: rov/mw   Anticoagulant Therapy  Managed by: Weston Brass, PharmD Referring MD: Sherryl Manges, MD PCP: Jacques Navy MD Supervising MD: Juanda Chance MD, Bruce Indication 1: Atrial Flutter (ICD-427.32) Indication 2: AMIODARONE (ICD-amio) Lab Used: [facility] Site: [facility] INR POC 2.3 INR RANGE 2 - 3  Dietary changes: no    Health status changes: no    Bleeding/hemorrhagic complications: no    Recent/future hospitalizations: no    Any changes in medication regimen? no    Recent/future dental: no  Any missed doses?: no       Is patient compliant with meds? yes       Allergies: No Known Drug Allergies  Anticoagulation Management History:      The patient is taking warfarin and comes in today for a routine follow up visit.  Negative risk factors for bleeding include an age less than 58 years old.  The bleeding index is 'low risk'.  Positive CHADS2 values include History of HTN.  Negative CHADS2 values include Age > 60 years old.  The start date was 10/26/1999.  His last INR was 1.6 RATIO.  Anticoagulation responsible Antoria Lanza: Juanda Chance MD, Smitty Cords.  INR POC: 2.3.  Cuvette Lot#: [id number].  Exp: 10/2011.    Anticoagulation Management Assessment/Plan:      The patient's current anticoagulation dose is Warfarin sodium 5 mg tabs: Use as directed by Anticoagulation Clinic.  The target INR is 2.0-3.0.  The next INR is due 10/16/2010.  Anticoagulation instructions were given to patient.  Results were reviewed/authorized by Weston Brass, PharmD.  He was notified by Weston Brass PharmD.         Prior Anticoagulation Instructions: INR 2.7 Continue taking 2 tablets on monday, wednesday, and friday. And 1.5 tablets all other days. Recheck in 3 weeks.  Current Anticoagulation Instructions: INR 2.3  Continue same dose of 1 1/2 tablets every day except 2 tablets on Monday, Wednesday and Friday.  Recheck INR in 4 weeks.

## 2010-10-26 NOTE — Medication Information (Signed)
Summary: rov/sp  Anticoagulant Therapy  Managed by: Bethena Midget, RN, BSN Referring MD: Sherryl Manges, MD PCP: Jacques Navy MD Supervising MD: Daleen Squibb MD, Maisie Fus Indication 1: Atrial Flutter (ICD-427.32) Indication 2: AMIODARONE (ICD-amio) Lab Used: [facility] Site: [facility] INR POC 1.8 INR RANGE 2 - 3  Dietary changes: yes       Details: Eating more leafy veggies  Health status changes: no    Bleeding/hemorrhagic complications: no    Recent/future hospitalizations: no    Any changes in medication regimen? no    Recent/future dental: no  Any missed doses?: no       Is patient compliant with meds? yes       Allergies: No Known Drug Allergies  Anticoagulation Management History:      The patient is taking warfarin and comes in today for a routine follow up visit.  Negative risk factors for bleeding include an age less than 12 years old.  The bleeding index is 'low risk'.  Positive CHADS2 values include History of HTN.  Negative CHADS2 values include Age > 85 years old.  The start date was 10/26/1999.  His last INR was 1.6 RATIO.  Anticoagulation responsible provider: Daleen Squibb MD, Maisie Fus.  INR POC: 1.8.  Cuvette Lot#: [id number].  Exp: 10/2011.    Anticoagulation Management Assessment/Plan:      The patient's current anticoagulation dose is Warfarin sodium 5 mg tabs: Use as directed by Anticoagulation Clinic.  The target INR is 2.0-3.0.  The next INR is due 11/13/2010.  Anticoagulation instructions were given to patient.  Results were reviewed/authorized by Bethena Midget, RN, BSN.  He was notified by Bethena Midget, RN, BSN.         Prior Anticoagulation Instructions: INR 2.3  Continue same dose of 1 1/2 tablets every day except 2 tablets on Monday, Wednesday and Friday.  Recheck INR in 4 weeks.   Current Anticoagulation Instructions: INR 1.8 Today take extra 2.5mg s then resume 7.5mg s daily except 10mg s on Mondays, Wednesdays and Fridays. Recheck in 4 weeks.

## 2010-11-13 ENCOUNTER — Encounter (INDEPENDENT_AMBULATORY_CARE_PROVIDER_SITE_OTHER): Payer: BC Managed Care – PPO

## 2010-11-13 ENCOUNTER — Encounter: Payer: Self-pay | Admitting: Cardiovascular Disease

## 2010-11-13 DIAGNOSIS — Z7901 Long term (current) use of anticoagulants: Secondary | ICD-10-CM

## 2010-11-13 DIAGNOSIS — I4892 Unspecified atrial flutter: Secondary | ICD-10-CM

## 2010-11-13 DIAGNOSIS — I4891 Unspecified atrial fibrillation: Secondary | ICD-10-CM

## 2010-11-21 NOTE — Medication Information (Signed)
Summary: rov/tm   Anticoagulant Therapy  Managed by: Cloyde Reams, RN, BSN Referring MD: Sherryl Manges, MD PCP: Jacques Navy MD Supervising MD: Clifton James MD, Cristal Deer Indication 1: Atrial Flutter (ICD-427.32) Indication 2: AMIODARONE (ICD-amio) Lab Used: [facility] Site: [facility] INR POC 1.8 INR RANGE 2 - 3  Dietary changes: yes       Details: Incr in vit K foods, decr ETOH in effort to lose weight.   Health status changes: no    Bleeding/hemorrhagic complications: no    Recent/future hospitalizations: no    Any changes in medication regimen? no    Recent/future dental: no  Any missed doses?: no       Is patient compliant with meds? yes       Allergies: No Known Drug Allergies  Anticoagulation Management History:      The patient is taking warfarin and comes in today for a routine follow up visit.  Negative risk factors for bleeding include an age less than 90 years old.  The bleeding index is 'low risk'.  Positive CHADS2 values include History of HTN.  Negative CHADS2 values include Age > 38 years old.  The start date was 10/26/1999.  His last INR was 1.6 RATIO.  Anticoagulation responsible provider: Clifton James MD, Cristal Deer.  INR POC: 1.8.  Exp: 10/2011.    Anticoagulation Management Assessment/Plan:      The patient's current anticoagulation dose is Warfarin sodium 5 mg tabs: Use as directed by Anticoagulation Clinic.  The target INR is 2.0-3.0.  The next INR is due 12/11/2010.  Anticoagulation instructions were given to patient.  Results were reviewed/authorized by Cloyde Reams, RN, BSN.  He was notified by Cloyde Reams, RN, BSN.         Prior Anticoagulation Instructions: INR 1.8 Today take extra 2.5mg s then resume 7.5mg s daily except 10mg s on Mondays, Wednesdays and Fridays. Recheck in 4 weeks.   Current Anticoagulation Instructions: INR 1.8  Take an extra 2.5mg  today, then start taking 10mg  daily except 7.5mg  on Sundays, Tuesdays, and Thursdays.   Recheck in 4 weeks.

## 2010-12-11 ENCOUNTER — Encounter: Payer: Self-pay | Admitting: Internal Medicine

## 2010-12-11 ENCOUNTER — Encounter (INDEPENDENT_AMBULATORY_CARE_PROVIDER_SITE_OTHER): Payer: BC Managed Care – PPO

## 2010-12-11 DIAGNOSIS — I4892 Unspecified atrial flutter: Secondary | ICD-10-CM

## 2010-12-11 DIAGNOSIS — Z7901 Long term (current) use of anticoagulants: Secondary | ICD-10-CM

## 2010-12-11 LAB — CONVERTED CEMR LAB: POC INR: 2.6

## 2010-12-21 NOTE — Medication Information (Signed)
Summary: [name] [name]  Anticoagulant Therapy  Managed by: Samantha Crimes, PharmD Referring MD: Sherryl Manges, MD PCP: Jacques Navy MD Supervising MD: Tenny Craw MD, Gunnar Fusi Indication 1: Atrial Flutter (ICD-427.32) Indication 2: AMIODARONE (ICD-amio) Lab Used: [facility] Site: [facility] INR POC 2.6 INR RANGE 2 - 3  Dietary changes: no    Health status changes: no    Bleeding/hemorrhagic complications: no    Recent/future hospitalizations: no    Any changes in medication regimen? no    Recent/future dental: no  Any missed doses?: no       Is patient compliant with meds? yes       Current Medications (verified): 1)  Altace 10 Mg Caps (Ramipril) .... Take 1 Tablet By Mouth Once A Day 2)  Lipitor 20 Mg Tabs (Atorvastatin Calcium) .... Take 1 Tablet By Mouth Once A Day 3)  Proscar 5 Mg Tabs (Finasteride) .... Take 1 Tablet By Mouth Once A Day 4)  Adult Aspirin Ec Low Strength 81 Mg  Tbec (Aspirin) .... Take 1 Tablet By Mouth Once A Day 5)  Multivitamins   Tabs (Multiple Vitamin) .... Take 1 Tablet By Mouth Once A Day 6)  Oscal 500/200 D-3 500-200 Mg-Unit  Tabs (Calcium-Vitamin D) .... Take 2 in Am 1 in Pm 7)  Fish Oil 1200 Mg  Caps (Omega-3 Fatty Acids) .... Take 1 Tablet By Mouth Once A Day 8)  Cod Liver Oil   Caps (Cod Liver Oil) .... Take 1 Tablet By Mouth Once A Day 9)  Flax Seed Oil 1000 Mg  Caps (Flaxseed (Linseed)) .... Take 1 Tablet By Mouth Once A Day 10)  Vitamin B-12 100 Mcg  Tabs (Cyanocobalamin) .... Take 1 Tablet By Mouth Once A Day 11)  Uroxatral 10 Mg Xr24h-Tab (Alfuzosin Hcl) .... Take One Tab Daily 12)  Vitamin D .... Once Daily 13)  Tumeric .... Once Daily 14)  Cranberry Caps (Cranberry) .... Once Daily 15)  Clonidine Hcl 0.1 Mg Tabs (Clonidine Hcl) .... Take 1/2 Tablet If Sbp Between 160-180 and Take 1 Tablet If Sbp >180 16)  Warfarin Sodium 5 Mg Tabs (Warfarin Sodium) .... Use As Directed By Anticoagulation Clinic 17)  Coq10 50 Mg Caps (Coenzyme Q10) .Marland Kitchen.. 1  By Mouth Daily  Allergies (verified): No Known Drug Allergies  Anticoagulation Management History:      Negative risk factors for bleeding include an age less than 38 years old.  The bleeding index is 'low risk'.  Positive CHADS2 values include History of HTN.  Negative CHADS2 values include Age > 46 years old.  The start date was 10/26/1999.  His last INR was 1.6 RATIO.  Anticoagulation responsible provider: Tenny Craw MD, Gunnar Fusi.  INR POC: 2.6.  Exp: 10/2011.    Anticoagulation Management Assessment/Plan:      The patient's current anticoagulation dose is Warfarin sodium 5 mg tabs: Use as directed by Anticoagulation Clinic.  The target INR is 2.0-3.0.  The next INR is due 12/11/2010.  Anticoagulation instructions were given to patient.  Results were reviewed/authorized by Samantha Crimes, PharmD.         Prior Anticoagulation Instructions: INR 1.8  Take an extra 2.5mg  today, then start taking 10mg  daily except 7.5mg  on Sundays, Tuesdays, and Thursdays.  Recheck in 4 weeks.   Current Anticoagulation Instructions: Return to clinic in 4 weeks on 4/16 @ 1145 Cont wtih current regimen

## 2011-01-01 ENCOUNTER — Other Ambulatory Visit: Payer: Self-pay | Admitting: Internal Medicine

## 2011-01-08 ENCOUNTER — Ambulatory Visit (INDEPENDENT_AMBULATORY_CARE_PROVIDER_SITE_OTHER): Payer: BC Managed Care – PPO | Admitting: *Deleted

## 2011-01-08 DIAGNOSIS — I4891 Unspecified atrial fibrillation: Secondary | ICD-10-CM

## 2011-01-08 LAB — POCT INR: INR: 1.9

## 2011-02-05 ENCOUNTER — Encounter: Payer: Self-pay | Admitting: *Deleted

## 2011-02-05 ENCOUNTER — Encounter: Payer: Self-pay | Admitting: Internal Medicine

## 2011-02-05 ENCOUNTER — Encounter: Payer: BC Managed Care – PPO | Admitting: *Deleted

## 2011-02-06 NOTE — Letter (Signed)
Feb 03, 2007    Rosalyn Gess. Norins, MD  [address]  [place], [place] [address]   RE:  SHRAGA, CUSTARD  MRN:  [id number]  /  DOB:  11-Nov-1945   Dear Casimiro Needle:   It is a pleasure to see Ursula Beath today.  He was seen last week  __________ scheduled to race in the [place].   He has been trying to modify his amiodarone and hopes to be able to get  off Tikosyn.  His most recent amiodarone level was 0.9, and  unfortunately, it has to be below 0.3 to be able to take the Tikosyn.   His PFT's were good last fall.  Chest CT showed no evidence of pulmonary  fibrosis.  I think there is no evidence at this time of pulmonary  fibrosis.   The autonomic's are doing better.  His blood pressure has been  regulated.  His pulse is a little bit more salt deplete, a little bit  less volume overloaded, and he is wearing support stockings and this  issue has been pretty quiecent of late.   MEDICATIONS:  1. Coumadin.  2. Proscar.  3. Lipitor.  4. Altace.  5. Amiodarone.  Prescription will be changed to read 100 mg a day.   PHYSICAL EXAMINATION:  VITAL SIGNS:  Blood pressure 146/84, pulse 67.  LUNG:  Clear.  HEART:  Sounds regular.  EXTREMITIES:  Without edema.   IMPRESSION:  1. Paroxysmal atrial fibrillation.  2. Amiodarone for #1.  3. Dysautonomia.  4. Hypertension.  Kathlene November, I believe Mr. Vandenberghe blood pressure is doing okay.  Dysautonomia  quiecent right now, which is good.  With some of the interventions that  we have been able to pose.  As it relates to his amiodarone,  we talked  about his dosing.  We will plan to keep him on 300 mg per day.   I will plan to see him again in about six months' time.    Sincerely,      Duke Salvia, MD, [facility]  Electronically Signed    SCK/MedQ  DD: 02/03/2007  DT: 02/03/2007  Job #: [id number] [id number]

## 2011-02-06 NOTE — Assessment & Plan Note (Signed)
[facility]                         ELECTROPHYSIOLOGY OFFICE NOTE   [name] [name], [name] [name]                     MRN:          [id number]  DATE:10/06/2007                            DOB:          November 20, 1945    The patient is seen again for atrial fibrillation.  There was some  confusion, but we ended up discussing his regime.  He has had some  paroxysms of his palpitations since decreasing from 300 daily to  300/200.  We will plan to keep him on his current dose.   We also had a discussion regarding pursuit of pulmonary vein isolation.  He had seen Dr. Delena Serve down at [facility] in the year 2000.  He will look  into his insurance as to whether this is a relationship that can be  covered under his current health care; in the event that it is we will  refer him back, in the event that it is not we will have him see Dr. Zadie Cleverly at [facility].   I will see him otherwise in six months.     Duke Salvia, MD, [facility]  Electronically Signed    SCK/MedQ  DD: 10/06/2007  DT: 10/06/2007  Job #: [id number]

## 2011-02-06 NOTE — Assessment & Plan Note (Signed)
 HEALTHCARE                         GASTROENTEROLOGY OFFICE NOTE   NAME:Wimer, DOLLIE MAYSE                     MRN:          [id number]  DATE:07/15/2007                            DOB:          [birth year]    Mr. Featherston is a 65 year old white male with multiple cardiovascular  problems including severe dysautonomia and recurrent cardiac arrhythmias  requiring chronic anticoagulation. I have seen him for many years  because of acid reflux which had been well controlled on daily PPI  therapy. He recently was diagnosed by Dr. Debby Bud as having mild  osteoporosis and underwent bone density scanning, the results of which  are unclear. In any case, Mr. Lederman stopped his Nexium therapy several  months ago.   He now presents with a globus sensation in his retropharyngeal area with  some hoarseness and constant throat clearing. He denies true acid reflux  symptoms, had no solid or liquid dysphagia. Has had no anorexia or  weight loss.   He is on multiple medications that are listed and reviewed in his chart.   Exam today showed that he weighed 208 pounds, blood pressure 102/62 and  pulse was 62 and regular.  Examination of the oropharyngeal area was unremarkable as was neck exam.   ASSESSMENT:  Mr. Krakow has documented chronic acid reflux, and I think he  has an extraesophageal manifestation of GERD with his current globus  sensation. I do not think at this point he needs repeat endoscopic exam  last done several years ago.   RECOMMENDATIONS:  1. Restart reflux maneuvers and Nexium 40 mg twice a day before      breakfast and supper.  2. Gastrointestinal followup in 2 months' time.  3. Will have Dr. Debby Bud call Mr. Carcamo about his bone density scan      which he says he has not received any feedback today.     Vania Rea. Jarold Motto, MD, [facility] Essex, FAGA  Electronically Signed    DRP/MedQ  DD: 07/15/2007  DT: 07/16/2007  Job #: [id number]   cc:   Rosalyn Gess.  Debby Bud, MD  Duke Salvia, MD, [facility]

## 2011-02-06 NOTE — Procedures (Signed)
NAME:  [name] [name], [name] [name] NO.:  [id number]   MEDICAL RECORD NO.:  [id number]          PATIENT TYPE:  OUT   LOCATION:  SLEEP CENTER                 FACILITY:  [facility]   PHYSICIAN:  Barbaraann Share, MD,FCCPDATE OF BIRTH:  03-05-1946   DATE OF STUDY:  10/29/2007                            NOCTURNAL POLYSOMNOGRAM   REFERRING PHYSICIAN:  Duke Salvia, MD, [facility]   INDICATION FOR STUDY:  Hypersomnia with sleep apnea.   EPWORTH SLEEPINESS SCORE:  9.   MEDICATIONS:   SLEEP ARCHITECTURE:  The patient had a total sleep time of 313 minutes  with no slow wave sleep and only 56 minutes of REM.  Sleep onset latency  was normal at 6 minutes, and REM onset was normal at 71 minutes.  Sleep  efficiency was mildly decreased at 80%.   RESPIRATORY DATA:  The patient was found to have 6 obstructive apneas  and 19 obstructive hypopneas for an apnea-hypopnea index of 5 events per  hour.  The patient was also noted to have 70 respiratory effort-related  arousals, giving him a respiratory disturbance index of 18 events per  hour.  The patient was noted to have moderate snoring, and it was also  noted that his obstructive events were more common during supine REM.  Split night criteria was not met secondary to the small numbers of  events.   OXYGEN DATA:  There was O2 desaturation as low as 85% with his  obstructive events.   CARDIAC DATA:  Occasional PAC, but no clinically significant arrhythmia.   MOVEMENT-PARASOMNIA:  None.   IMPRESSIONS-RECOMMENDATIONS:  Small numbers of obstructive events which  do not meet the apnea-hypopnea index criteria for the obstructive sleep  apnea syndrome.  However, the patient was noted to have large numbers of  respiratory effort-related arousals, giving him a respiratory  disturbance index of 18 events per hour.  This may represent clinically significant sleep-disordered breathing,  depending upon the patient's history and symptoms.  Clinical  correlation  is suggested.      Barbaraann Share, MD,FCCP  Diplomate, American Board of Sleep  Medicine  Electronically Signed     KMC/MEDQ  D:  11/11/2007 07:24:36  T:  11/11/2007 15:34:09  Job:  [id number]

## 2011-02-06 NOTE — Assessment & Plan Note (Signed)
[facility]                         ELECTROPHYSIOLOGY OFFICE NOTE   NAME:Bensinger, JUDSON TSAN                     MRN:          [id number]  DATE:07/26/2008                            DOB:          Nov 25, 1945    Mr. Carlisi comes in about 8 months following his catheter ablation for his  atrial fibrillation by Dr. Elesa Massed.  He had one episode of tachycardia that  was documented in April and has one episode in September.  He is  tentatively scheduled for another procedure in February.  He is not sure  yet whether he is going to keep that or not.   He is having much less orthostatic dizziness.  He is taking his Mestinon  once a day.  He is off the amiodarone and there is some question about  whether it was contributing or not.   He also takes Coumadin, Proscar, Lipitor, Altace, and Uroxatral.   PHYSICAL EXAMINATION:  VITAL SIGNS:  His blood pressure today was 104/60  with the pulse of 75, his weight was 217 which is stable.  LUNGS:  Clear.  HEART:  Sounds were regular.  EXTREMITIES:  Without edema.   IMPRESSION:  1. Atrial fibrillation status post ablation.  2. Dysautonomia.  3. Coumadin therapy.  4. Coronary artery disease - nonobstructive.   Mr. Tutson is doing quite well.  He is scheduled to undergo repeat  ablation in February.  He is to see Dr. Juanda Chance in February, also for  repeat stress Myoview scanning.  I also planned to see him again in  April.     Duke Salvia, MD, [facility]  Electronically Signed    SCK/MedQ  DD: 07/26/2008  DT: 07/27/2008  Job #: [id number]

## 2011-02-06 NOTE — Assessment & Plan Note (Signed)
[facility]                         GASTROENTEROLOGY OFFICE NOTE   [name] [name], Dacosta CAN LUCCI                     MRN:          [id number]  DATE:09/23/2007                            DOB:          1945-11-23    SUBJECTIVE:  [name] [name] is doing well.  He has had less hoarseness.  His  globus sensation is decreased on Nexium 40 mg twice daily.  He still has  some problems.  I have asked him to continue twice daily PPI therapy  until his hoarseness is cleared, and then go back to once daily  treatment.  He does have osteopenia.  I see no reason why he could not  take a trial of Fosamax on a weekly dose while he is on Nexium therapy.  I will leave the decision up to Dr. Rosalyn Gess. Norins, who is his  primary care physician.  He has an appointment today to see Dr. Duke Salvia, for cardiology followup.   I have checked this patient for celiac disease and the celiac antibodies  were negative.   FOLLOWUP:  I will see him on a p.r.n. basis in the future as needed.  It  is of note that the patient is on multiple cardiac drugs including daily  Coumadin.     Vania Rea. Jarold Motto, MD, [facility], FAGA  Electronically Signed    DRP/MedQ  DD: 09/23/2007  DT: 09/23/2007  Job #: [id number]   cc:   Rosalyn Gess. Debby Bud, MD  Duke Salvia, MD, [facility]

## 2011-02-06 NOTE — Assessment & Plan Note (Signed)
[facility]                            CARDIOLOGY OFFICE NOTE   [name] [name], [name] [name] CALEB DECOCK                     MRN:          [id number]  DATE:11/15/2008                            DOB:          1946-02-23    PRIMARY CARE PHYSICIAN:  Rosalyn Gess. Norins, MD   CLINICAL HISTORY:  Brek Reece returned for a followup management of his  coronary heart disease.  He has recurrent atrial fibrillation and has  undergone an atrial fib ablation by Dr. Delena Serve in [place] in  March 2009.  He had a CT angio as followup, which showed progressive  disease.  He previously had a cath here in 2007, which showed  nonobstructive disease.  He had had Myoview scan few months before and  we did not recommend any changes except continued intensive secondary to  risk factor modification.  We did recommend a followup Myoview at this  time, which he had prior to this visit.  This showed good exercise  tolerance at 12 minutes with no chest pain, no EKG changes, and no  ischemia.   Merlyn Albert has felt well and has not had any chest pain or shortness of  breath.  He has worked out regularly at the [employer].  He has had episodes of atrial fibrillation, may be every other month  which last an hour or two.  One of these was documented here.  He was  scheduled to see Dr. Delena Serve in about 5 months for possible repeat  ablation.   PAST MEDICAL HISTORY:  Significant for postural hypotension and  autonomic dysfunction.  This improved after the amiodarone was  discontinued prior to his ablation.  He also has hyperlipidemia,  obstructive sleep apnea, and GERD.   CURRENT MEDICATIONS:  Altace 10 mg daily, Coumadin, Lipitor 20 mg daily,  Proscar, aspirin 81 mg daily, vitamins, Os-Cal, fish oil, flaxseed oil,  and Uroxatral.   PHYSICAL EXAMINATION:  VITAL SIGNS:  Blood pressure is 107/68, pulse 75  and regular.  NECK:  There was no venous tension.  The carotid pulses were  full  without bruits.  CHEST:  Clear.  CARDIAC:  Rhythm was regular.  There are no murmurs or gallops.  ABDOMEN:  Soft, normal bowel sounds.  EXTREMITIES:  Peripheral pulses were full with no peripheral edema.   IMPRESSION:  1. Nonobstructive coronary artery disease with a negative Myoview      scan.  2. Atrial fibrillation status post atrial fibrillation ablation March      2009, with infrequent recurrences.  3. Hyperlipidemia.  4. Hypertension.  5. Obstructive sleep apnea.  6. Gastroesophageal reflux disease.  7. Autonomic dysfunction with possible hypotension, improved.   RECOMMENDATIONS:  I think Merlyn Albert is doing well from the standpoint of his  ischemic heart disease.  He does have quite a bit of plaque by CT angio  and I think it is appropriate to recommend another followup Myoview scan  in 1 year.  We will also plan to get a lipid liver, CBC, and BMP on him  to make sure his lipid profile still doing well.  He had an ideal  profile 6 months ago.  I will also make an appointment with Dr. Graciela Husbands to  give him his perspective on recurrent ablation.     Bruce Elvera Lennox Juanda Chance, MD, [facility]  Electronically Signed    BRB/MedQ  DD: 11/15/2008  DT: 11/16/2008  Job #: [id number]   cc:   Ronn Melena, MD

## 2011-02-06 NOTE — Assessment & Plan Note (Signed)
[facility]                            CARDIOLOGY OFFICE NOTE   Wren, Gallaga [name] [name]                     MRN:          [id number]  DATE:05/24/2008                            DOB:          08-02-1946    CLINICAL HISTORY:  [name] [name] came in for visit because of a recent  abnormal CT angio.  He had atrial fib ablation by Dr. Delena Serve in [place] about 5 months ago and had a followup CT angio to check for  pulmonary veins stenosis and this showed progression of his disease.  The fractional disease compared to a catheterization that we performed  in March 2007.  In March 2007, we found 20% and 30% stenoses.  In the CT  angio, there was 30-60% proximal stenosis in the LAD with severe mid and  distal disease and the circumflex artery was greater than 60% proximal  stenosis and 30-60% stenosis in the obtuse marginal and the right  coronary was 30-60% proximal stenosis with severe distal disease.  He  has had normal LV function.   [name] [name] has not had any symptoms and he exercises regularly by walking and  also on the elliptical.  He also had a rest stress exercise Myoview in  February of this year, which showed good exercise tolerance and no  evidence of ischemia.   His past history is significant for postural hypotension and some  autonomic dysfunction, which has improved since he had been off  amiodarone following his flutter ablation.  He also has hyperlipidemia.  He also has obstructive sleep apnea and GERD.  There was question of  some fibrosis related Amiodarone, but his last CT did indicate that.   CURRENT MEDICATIONS:  1. Coumadin.  2. Aspirin.  3. Fish oil.  4. Proscar.  5. Lipitor 20 mg daily.  6. Cod liver oil.  7. Altace 10 mg daily.  8. Mestinon.  9. Flaxseed oil.  10.B12.  11.Uroxatral.   PHYSICAL EXAMINATION:  VITAL SIGNS:  Blood pressure is 134/78 and pulse  92 and regular.  NECK:  There was no venous tension.  The carotid  pulses were full  without bruits.  CHEST:  Clear.  HEART:  Rhythm was regular.  No murmurs or gallops.  ABDOMEN:  Soft with normal bowel sounds.  EXTREMITIES:  Peripheral pulses full with no peripheral edema.   IMPRESSION:  1. Coronary artery disease with abnormal CT angiogram recently      suggesting progression of disease with anatomy as described above.  2. Atrial fibrillation, status post atrial fibrillation ablation 5      months ago with 1 episode of recurrent atrial flutter and April of      this year.  3. Hyperlipidemia.  4. Hypertension.  5. Obstructive sleep apnea.  6. Gastroesophageal reflux disease.  7. Dysautonomia with postural hypotension.   RECOMMENDATIONS:  [name] [name] is currently asymptomatic and has a nonischemic  scan as of February of this year despite having more extensive disease  by CT angio.  I explained that the CT angio was a different technique  than a catheterization and we could  not compare them directly and the CT  angio was not particularly good at quantifying lesions.  I think, the  main massage is to optimize his risk factor control, but he has already  done an excellent job with this.  His HDL is 47, his LDL is 60 and his  triglycerides are quite low.  He is exercising regularly and his blood  pressure is under good control.  We will plan to continue his current  treatment.  We will wait for him to the come back for followup rest  stress Myoview scan in February and office visit after that.     Bruce Elvera Lennox Juanda Chance, MD, [facility]  Electronically Signed    BRB/MedQ  DD: 05/24/2008  DT: 05/25/2008  Job #: [id number]   cc:   Rosalyn Gess. Debby Bud, MD  Duke Salvia, MD, [facility]

## 2011-02-06 NOTE — Assessment & Plan Note (Signed)
[facility]                           PRIMARY CARE OFFICE NOTE   NAME:[name] [name], [name] [name]                     MRN:          [id number]  DATE:01/31/2007                            DOB:          January 08, 1946    [name] [name] is a 65 year old gentleman who presents for routine follow up,  evaluation, and exam. He was last seen in the office on 04/09/06, please  see that dictation.   INTERVAL HISTORY:  The patient has been doing well. He does have severe  dysautonomia diagnosed by Dr. Barbra Sarks at the Department of  Cardiology and Electrophysiology at [facility] and has been doing well  on Mestinon. He had been able to reduce his hypertensive medications as  previously noted. The patient reports that he continues to do well,  although he occasionally has some drops in blood pressure. He does use  support hose on a regular basis and has liberalized his salt intake. He  also has reduced his free water intake. He reports that he only has had  to use Clonidine on rare occasion for hypertensive episodes.   The patient reports some pain and discomfort in his right shoulder which  has been present for several weeks. It does not really limit his  activities, but he does find that it causes some discomfort,  particularly with his workout.   PAST MEDICAL HISTORY:   SURGICAL:  Tonsillectomy at age 58.   MEDICAL:  1. Usual childhood disease.  2. DJD, knees.  3. Chronic leukopenia.  4. Chronic atrial fibrillation.  5. GERD.  6. Erectile dysfunction.  7. CAD with a 60% left anterior descending artery obstruction.  8. 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  9. Severe dysautonomia with hypotension.   CURRENT MEDICATIONS:  1. Coumadin as directed.  2. Aspirin 81 mg daily.  3. Multivitamin daily.  4. Fish oil 1000 mg daily.  5. Proscar 5 mg daily.  6. Lipitor 20 mg daily.  7. Altace 10 mg daily.  8. Amiodarone 400 mg  daily.  9. UroXatral daily.  10.Mestinon 1/2 tablet daily.   PHYSICIAN ROSTER:  1. Dr. Berton Mount for electrophysiology at [facility].  2. Dr. Hollace Kinnier for electrophysiology at [facility]  3. Dr. Jethro Bolus for urology.  4. Dr. Sheryn Bison for GI.  5. Dr. Dorinda Hill for dermatology.  6. Dr. Reva Bores for orthopedics.  7. Dr. Sol Blazing for ophthalmology.   FAMILY HISTORY:  Well documented in my note of 06/29/05 notable for his  mother has TIAs and known carotid artery disease, but she is still very  active and functional. His father had a history of gastrointestinal  stromal tumor, history of CAD with MI. Two secondary relatives with  colon cancer noted.   SOCIAL HISTORY:  The patient continues to work at [employer]  office, a work that he loves. His daughter is training in competition  for a seat on the olympic team for rowing with hopes of going to [place].   Please see note my note of June 29, 2005 for a complete review of the  patient's record in terms of studies with no new studies added.   REVIEW OF SYSTEMS:  The patient has had an intentional 15 pound weight  loss with no other constitutional symptoms. The patient has had an eye  exam in the last 12 months and is followed by cataracts, right eye  greater than left. The patient has had a cap and crown done, but no  other dental repair. There are no cardiovascular, respiratory, GI, GU,  musculoskeletal, dermatologic, or neurologic problems. The patient  reports that he actually sees Dr. Patsi Sears for urology, at this time  with nocturia x1. The patient has seen Dr. Dorinda Hill in the last  12 months for a full skin exam.   PHYSICAL EXAMINATION:  VITAL SIGNS:  Temperature 96.5, blood pressure  120/74, pulse 57, weight 218 pounds with a BMI of 24.6 which puts him  clearly in the normal range. Height 6 feet 7 inches.  GENERAL:  This is a well developed, well nourished gentleman who appears   to be in no acute distress.  HEENT:  Normocephalic atraumatic. External auditory canals and tympanic  membranes were normal. Oral pharynx with native dentition, good repair  is noted, no buckle or pallow lesions were noted, posterior pharynx was  clear. Conjunctiva and sclerae was clear. Pupils equal, round, and  reactive.  FUNDUSCOPIC:  Differed to Dr. Hazle Quant.  NECK:  Supple without thyromegaly. No lymphadenopathy was in the  cervical supraclavicular regions.  CHEST:  No CVA tenderness. No deformities were noted.  LUNGS:  Clear with no rales, wheezes, or rhonchi. Good breath sounds  throughout. The patient has no increased work of breathing.  CARDIOVASCULAR:  2+ radial pulse. No JVD. No carotid bruits. He had a  quiet precordium with a regular rate and rhythm without murmurs, rubs,  or gallops.  ABDOMEN:  Soft, no guarding or rebound, no organo splenomegaly was  noted.  GENITALIA: Referred to Dr. Patsi Sears.  RECTAL:  Referred to Dr. Patsi Sears.  EXTREMITIES:  No cyanosis, clubbing, or edema. No deformities were  noted.  NEUROLOGIC:  Non-focal.   LABORATORY DATA:  PSA was normal at 0.55 and this is from December 09, 2006. TSH was normal at 1.05. Lipid profile showed a cholesterol of 131,  triglycerides of 32, HDL 54.6, LDL 70. Liver functions were normal with  an SGOT of 35, SGPT 16. Basic metabolic panel was unremarkable with a  serum glucose of 91. Kidney function was normal with a creatinine of  1.2.   ASSESSMENT AND PLAN:  1. Atrial fibrillation. The patient is currently stable. He is      interested in alternatives to Amiodarone. Dr. Odessa Fleming last office      note does mention the possibility of Tikosyn. I have left this to      the patient and Dr. Graciela Husbands in regards to changing medication.  2. Dysautonomia with hypotension. The patient is markedly improved and      doing well with much better control of blood pressure and fewer     near syncopal episodes. The plan is the patient  is to continue with      Mestinon as noted and with Altace as his sole anti-hypertensive      agent with the use of Clonidine on a p.r.n. basis.  3. GERD, stable.  4. GU. The patient is under the care of Dr. Patsi Sears and seems to be      doing well at  this time.  5. Health maintenance. The patient is current with colorectal cancer      screening and is due for colonoscopy in 2010. His PSA was normal.   In summary, the patient is with the problems as outlined above who  actually seems to be doing quite well at this time. He is current and up-  to-date with health maintenance, including having had Zostavax on  04/09/06. I do not have documentation of pneumonia vaccine and if not  done this would be appropriate for this patient.     Rosalyn Gess Norins, MD  Electronically Signed    MEN/MedQ  DD: 02/02/2007  DT: 02/03/2007  Job #: [id number]   cc:   Sheppard Penton, MD

## 2011-02-06 NOTE — Assessment & Plan Note (Signed)
[place] [facility]                         ELECTROPHYSIOLOGY OFFICE NOTE   NAME:Cobert, EZREAL TURAY                     MRN:          [id number]  DATE:01/21/2008                            DOB:          Nov 03, 1945    Mr. Geno is seen in followup for atrial flutter following a catheter  ablation for atrial fibrillation undertaken by Dr. Ronn Melena down  at [facility].  He had recurrent flutter that terminated spontaneously.   We will plan to see him, again, following his visit to Dr. Delena Serve which  is scheduled in 2 months' time.  He is to call us if any problems in the  interim.     Duke Salvia, MD, [facility]  Electronically Signed    SCK/MedQ  DD: 01/21/2008  DT: 01/21/2008  Job #: [id number]

## 2011-02-06 NOTE — Procedures (Signed)
NAME:  [name] [name], [name] [name] NO.:  [id number]   MEDICAL RECORD NO.:  [id number]          PATIENT TYPE:  OUT   LOCATION:  SLEEP CENTER                 FACILITY:  [facility]   PHYSICIAN:  Barbaraann Share, MD,FCCPDATE OF BIRTH:  11-Apr-1946   DATE OF STUDY:  01/05/2008                            NOCTURNAL POLYSOMNOGRAM   REFERRING PHYSICIAN:  Leslye Peer, MD   INDICATION FOR STUDY:  Hypersomnia with sleep apnea.  The patient was  found to have mild obstructive sleep apnea on his most recent sleep  study with daytime symptomatology.   EPWORTH SLEEPINESS SCORE:  12.   MEDICATIONS:   SLEEP ARCHITECTURE:  The patient had total sleep time of 313 minutes  with decreased REM and no slow wave sleep achieved.  Sleep onset latency  was normal at 2.5 minutes, and REM onset was prolonged at 137 minutes.  Sleep efficiency was decreased at 84%.   RESPIRATORY DATA:  The patient underwent a CPAP titration protocol with  a large ResMed Quattro full face mask.  The patient was started at an  initial pressure of 5 cm and then increased for breakthrough obstructive  events and snoring.  The patient was found to have an optimal CPAP  pressure of 13 to 14 cm of water pressure.  Tolerance seemed to be  excellent.   OXYGEN DATA:  There was oxygen desaturation as low as 84% transiently  until the patient reached optimal CPAP.  He maintained oxygen  saturation's even through REM after optimal CPAP.   CARDIAC DATA:  The patient was found to have an occasional premature  beat that was difficult to characterize whether atrial or ventricular in  nature.  There were no clinically significant arrhythmia's noted.   MOVEMENT-PARASOMNIA:  The patient was found to have small numbers of leg  jerks with no clinically significant sleep disruption.   IMPRESSIONS-RECOMMENDATIONS:  CPAP titration study reveals good control  of previously documented obstructive sleep apnea with 13 to 14 cm of  CPAP,  delivered by a large ResMed Quattro full face mask.  I would  probably start with a pressure of 13 and see how he responds.      Barbaraann Share, MD,FCCP  Diplomate, American Board of Sleep  Medicine  Electronically Signed     KMC/MEDQ  D:  01/20/2008 16:33:04  T:  01/20/2008 17:33:14  Job:  [id number]

## 2011-02-06 NOTE — Assessment & Plan Note (Signed)
[facility]                         ELECTROPHYSIOLOGY OFFICE NOTE   NAME:[name], [name] GOODLEY                     MRN:          [id number]  DATE:09/23/2007                            DOB:          04-Dec-1945    [name] [name] is seen, he has paroxysmal atrial fibrillation, orthostatic  intolerance.  His orthostatic intolerance is much improved.  His atrial  fibrillation is also fairly quiescent.  He saw Dr. Juanda Chance a couple of  weeks ago.  I do not have access to that information.  He does have  atypical chest pain and underwent catheterization, demonstrating no  significant obstruction as best as I can recall.   CURRENT MEDICATIONS:  1. Amiodarone 300 a day.  2. Coumadin.  3. Aspirin.  4. Proscar.  5. Lipitor.  6. Altace.   PHYSICAL EXAMINATION:  His blood pressure was 120/70.  His pulse was 54.  Weight was 216.  LUNGS:  Clear.  SKIN:  Warm and dry.  EXTREMITIES:  Without edema.   Electrocardiogram demonstrated sinus rhythm at 54 with intervals of  0.21, 0.106, 0.46 with a QTc of 0.43.   IMPRESSION:  1. Paroxysmal atrial fibrillation.  2. Amiodarone for #1.  3. Relative bradycardia.  4. Dysautonomia.  5. Atypical chest pain.   We will plan to see him again in 6 months' time.  Also, give him an  amiodarone surveillance card with plan to begin in 6 months' time.     Duke Salvia, MD, [facility]  Electronically Signed    SCK/MedQ  DD: 09/23/2007  DT: 09/23/2007  Job #: [id number]

## 2011-02-06 NOTE — Assessment & Plan Note (Signed)
[place] [facility]                         ELECTROPHYSIOLOGY OFFICE NOTE   [name], [name] [name]                     MRN:          [id number]  DATE:12/25/2007                            DOB:          Dec 09, 1945    HISTORY OF PRESENT ILLNESS:  [name] is here today for unscheduled  visit.  He is a very pleasant 65 year old man with a history of coronary  disease and hypertension who has longstanding atrial fibrillation.  He  recently underwent catheter ablation of atrial fibrillation down in  [place], [place], by Dr. Ronn Melena.  His procedure was  otherwise uncomplicated, and he has been maintained on Lovenox  injections while his Coumadin therapy has been reinitiated.  The patient  notes that approximately 2 days ago he noted some swelling around his  right leg and this was increased yesterday, and he is here today for  additional evaluation.  He noted this after going out and doing some  walking.  He otherwise had no specific complaints.  He denies fevers or  chills.  He denies shortness of breath or chest pain.   PHYSICAL EXAMINATION:  GENERAL:  He is a pleasant, well-appearing 65-  year-old man in no acute distress.  The extremities demonstrated no  edema.  His right groin had an area of ecchymoses and swelling, though  this is not particularly severe.  His blood pressure was 125/70.  The  pulse was 70 and regular.   IMPRESSION:  1. History of atrial fibrillation status post ablation.  2. Ongoing Coumadin and Lovenox therapy.  3. New-onset swelling around the right groin and right lower leg.   DISCUSSION:  The patient probably just had some residual bleeding after  his catheter ablation therapy.  There is a small hematoma in this area.  It is minimally tender.  My plan today is to have him undergo duplex  vascular study of his right groin to make sure there is no  pseudoaneurysm and make sure there is no evidence of DVT.  For now,  however will continue his Lovenox and his Coumadin and if there are  problems with the scan, will make adjustments based on the results of  his venous Dopplers.     [name]. [name], MD  Electronically Signed    GWT/MedQ  DD: 12/25/2007  DT: 12/26/2007  Job #: [id number]

## 2011-02-07 ENCOUNTER — Ambulatory Visit (INDEPENDENT_AMBULATORY_CARE_PROVIDER_SITE_OTHER): Payer: BC Managed Care – PPO | Admitting: Internal Medicine

## 2011-02-07 ENCOUNTER — Encounter: Payer: Self-pay | Admitting: Internal Medicine

## 2011-02-07 ENCOUNTER — Ambulatory Visit (INDEPENDENT_AMBULATORY_CARE_PROVIDER_SITE_OTHER): Payer: BC Managed Care – PPO | Admitting: *Deleted

## 2011-02-07 DIAGNOSIS — I4891 Unspecified atrial fibrillation: Secondary | ICD-10-CM

## 2011-02-07 DIAGNOSIS — E785 Hyperlipidemia, unspecified: Secondary | ICD-10-CM

## 2011-02-07 DIAGNOSIS — I2510 Atherosclerotic heart disease of native coronary artery without angina pectoris: Secondary | ICD-10-CM

## 2011-02-07 LAB — POCT INR: INR: 1.9

## 2011-02-07 NOTE — Patient Instructions (Addendum)
Your physician wants you to follow-up in: 6 months. You will receive a reminder letter in the mail two months in advance. If you don't receive a letter, please call our office to schedule the follow-up appointment.  Your physician has recommended you make the following change in your medication: Decrease lipitor to 10mg  once daily.  Your physician recommends that you return for lab work in: 8 weeks: lipid/liver (272.0;v58.69)

## 2011-02-07 NOTE — Assessment & Plan Note (Signed)
His LDL is 68 his HDL is 49. He would like to decrease his Lipitor. We'll do that from 20-12. We'll need a repeat lipids in a week's time.

## 2011-02-07 NOTE — Assessment & Plan Note (Signed)
Holding sinus rhythm following pulmonary vein isolation at [facility].  He has a CAD score of 3. Long-term anticoagulation remains indicated. We discussed alternatives. We will plan to wait until a fall whenapixoban released and began it at that time

## 2011-02-07 NOTE — Progress Notes (Signed)
  HPI  [name] [name] is a 65 y.o. male Seen in followup for atrial fibrillation for which he status post catheter ablation and a new [place]. He also has coronary disease hypertension and has had presumptive problems with dysautonomia  He has done well.The patient denies chest pain, shortness of breath, nocturnal dyspnea, orthopnea or peripheral edema.  There have been no palpitations, lightheadedness or syncope.   His LDL is 68. He would like to decrease his Lipitor.  Past Medical History  Diagnosis Date  . DJD (degenerative joint disease)   . Leukopenia   . HTN (hypertension)   . CAD (coronary artery disease)   . COPD (chronic obstructive pulmonary disease)   . Osteoporosis   . Atrial fibrillation   . Cataract, nonsenile   . Finger, superficial foreign body (splinter), without major open wound, infected     Past Surgical History  Procedure Date  . Tonsillectomy   . Rf ablation for a. fib '09 - dr. Delena Serve, [place]. x 2   . Cataract extraction     Current Outpatient Prescriptions  Medication Sig Dispense Refill  . alfuzosin (UROXATRAL) 10 MG 24 hr tablet Take 10 mg by mouth daily.        Marland Kitchen aspirin 81 MG tablet Take 81 mg by mouth daily.        Marland Kitchen atorvastatin (LIPITOR) 20 MG tablet Take 20 mg by mouth daily.        . calcium-vitamin D (OSCAL WITH D) 500-200 MG-UNIT per tablet 2 tabs po am and 1 tab po pm       . CloNIDine HCl 0.1 MG TB12 Take 1/2 tablet if sbp between 160-180 and take 1 tablet if sbp >180       . finasteride (PROSCAR) 5 MG tablet Take 5 mg by mouth daily.        . Flaxseed, Linseed, 1000 MG CAPS 1 tab po qd       . Multiple Vitamin (MULTIVITAMIN) capsule Take 1 capsule by mouth daily.        . Omega-3 Fatty Acids (FISH OIL) 1200 MG CAPS 1 tab po qd       . ramipril (ALTACE) 10 MG capsule Take 10 mg by mouth daily.        . TURMERIC PO 1 tab po qd       . vitamin B-12 (CYANOCOBALAMIN) 100 MCG tablet Take 50 mcg by mouth daily.        . Vitamins C E (CRANBERRY  CONCENTRATE PO) 1 tab po qd       . warfarin (COUMADIN) 5 MG tablet Take 1 tablet (5 mg total) by mouth as directed.  60 tablet  3    Allergies not on file  Review of Systems negative except from HPI and PMH  Physical Exam Well developed and well nourished in no acute distress HENT normal E scleral and icterus clear Neck Supple JVP flat; carotids brisk and full Clear to ausculation Regular rate and rhythm, no murmurs gallops or rub Soft with active bowel sounds No clubbing cyanosis and edema Alert and oriented, grossly normal motor and sensory function Skin Warm and Dry  ECGsinus rhythm with at 86 next item hold 0.19 5.09/0.37 Otherwise normal  Assessment and  Plan

## 2011-02-07 NOTE — Assessment & Plan Note (Signed)
stable °

## 2011-02-09 NOTE — Assessment & Plan Note (Signed)
[place] [facility]                           ELECTROPHYSIOLOGY OFFICE NOTE   [name] [name], [name] [name] [name] [name]                     MRN:          [id number]  DATE:08/14/2006                            DOB:          June 22, 1946    [name] [name] is seen today. He has paroxysmal atrial fibrillation controlled  currently by amiodarone.   He developed orthostatic intolerance, and he was seen by Dr. Rickard Patience  at [facility]. We have the initial intake report, but we do not have a copy of the  final which as he recounts says that he has pure autonomic failure. She is  treating him currently with Mestinon, and he remains on amiodarone, the  survey of which we had done that had no issues with orthostatic intolerance.   His symptoms are much better. His salt intake has improved. His water intake  is down.   On examination today, his blood pressure is 128/78, pulse 58.  LUNGS:  Clear.  HEART:  Sounds were regular.  EXTREMITIES:  Without edema.   Electrocardiogram demonstrated sinus rhythm at 58 with intervals of  0.22/0.10/0.45.   IMPRESSION:  1. Paroxysmal atrial fibrillation.  2. Amiodarone for #1.  3. Autonomic failure with orthostatic intolerance and supine hypertension.   [name] [name] and I had a lengthy discussion about amiodarone. He continues to be  concerned about the pulmonary toxicity given the long-standing therapy and  the high dose. He did undergo CT scanning and PFTs earlier this year because  of his dyspnea. These were unrevealing.   If I recall, he also saw Dr. Delton Coombes at that point.   We will plan to check an amiodarone level and to consider the use of Tikosyn  as an alternative if his amiodarone level is sufficiently low.   We will contact Dr. Graciela Husbands to see if we can get a copy of the report from his  followup visit.   We will see him again in six months' time.     Duke Salvia, MD, [facility]  Electronically Signed    SCK/MedQ  DD: 08/14/2006  DT:  08/14/2006  Job #: [id number]

## 2011-02-09 NOTE — Cardiovascular Report (Signed)
NAME:  [name], [name] NO.:  [id number]   MEDICAL RECORD NO.:  [id number]          PATIENT TYPE:  OIB   LOCATION:  1963                         FACILITY:  [facility]   PHYSICIAN:  Charlies Constable, M.D. [facility] DATE OF BIRTH:  1946-08-02   DATE OF PROCEDURE:  12/14/2005  DATE OF DISCHARGE:  12/14/2005                              CARDIAC CATHETERIZATION   PAST MEDICAL HISTORY:  [name] [name] is 65 years old and works as a Arts administrator.  He has paroxysmal atrial fibrillation and is on amiodarone for this.  He has  had a history of nonobstructive coronary disease documented on  catheterization a number of years ago.  Recently he has developed symptoms  of shortness of breath with exertion.  He was evaluated by Dr. Graciela Husbands with  pulmonary function tests and a CT scan of the chest to rule out amiodarone  toxicity and these studies were negative.  He had a stress Cardiolite scan  during which time he dropped his blood pressure.  There was no ischemic  defects seen on the scan itself.  Because of his symptoms and his abnormal  blood pressure response to exercise, he was scheduled for evaluation with  angiography in the outpatient laboratory.   PROCEDURES:  The procedure was performed by right femoral artery and  arterial sheath and 5-French free-form coronary catheters.  A frontal artery  branch was used and non-opaque contrast was used.  This aortogram was  performed to rule out renal vascular causes for renal insufficiency and high  blood pressure.  He tolerated this procedure well and left the laboratory in  satisfactory condition.   RESULTS:  The left main coronary artery had minimal narrowing at its mid  portion estimated at 20% or less.   Left anterior descending artery gave rise to a very large septal perforator,  three diagonal branches, and several more small septal perforators.  Artery  was irregular with 40% narrowing in the mid portion of the vessel.  This has  previously  been described as 50% narrow.   The circumflex artery gave rise to a large marginal branch and two  posterolateral branches.  There was 30% narrowing in the proximal portion  before the first marginal branch.   The right coronary artery is a large dominant vessel, is very large in  caliber and gave rise to a conus branch, a right ventricular branch, a  posterior descending branch, and three posterolateral branches.  These  vessels were free of significant disease.   The left ventriculogram showed good wall motion with no areas of  hypokinesis.  Estimated ejection fraction was 55% to 60%.   Flow gram was performed which showed patent renal arteries and no  significant obstruction.   Aortic pressure was 125/70 with a mean of 95, left pressure is 125/11.   CONCLUSION:  Nonobstructive coronary artery disease with 20% narrowing in  the left main coronary artery, 40% narrowing in the mid LAD, 30% narrowing  in the proximal circumflex artery, and no significant obstruction of the  right coronary artery with normal LV function.   RECOMMENDATIONS:  The patient  has only mild nonobstructive disease and there  is no clear cardiac explanation for his exertional dyspnea.  I discussed the  situation with Dr. Graciela Husbands and Dr. Gala Romney, and we will  plan further evaluation with CT to see if we can ascertain the etiology of  his shortness of breath.  He should have continued secondary risk factor  modification and will resume his Coumadin tonight.  He is back on amiodarone  therapy currently.           ______________________________  Charlies Constable, M.D. [facility]     BB/MEDQ  D:  12/14/2005  T:  12/17/2005  Job:  [id number]   cc:   Duke Salvia, M.D.  [address]  [address]  [place]  [place] [address]   Rosalyn Gess. Norins, M.D. [facility]  [address]  [place]  [place] [address]   CARDIOPULMONARY LAB

## 2011-02-09 NOTE — Assessment & Plan Note (Signed)
[facility]                           ELECTROPHYSIOLOGY OFFICE NOTE   [name] [name], [name] [name]                            MRN:          [id number]  DATE:  12/05/2005                              DOB:      1946-02-02    ADDENDUM:  To letter dated December 05, 2005, job #[id number].   [name] [name] having been seen on March 14 was referred for Cardiolite scanning.  He was seen because of shortness of breath.  His blood pressure was 120/75.  His lungs were clear.  His heart sounds were irregular.  Extremities were  without edema.   He was submitted for Myoview scanning which was notable for a significant  drop in blood pressure during exercise from 152 to 109 systolic.  Notwithstanding the negative diagnostic images.  Because of the significant  decrease in exercise associated blood pressure the patient (was) recommended  for catheterization to exclude global ischemia as a mechanism of his  exercise associated hypertension.   The procedure was reviewed with the patient by Dr. Jerral Bonito and then  submitted for Dr. Regino Schultze performance.                                   Duke Salvia, MD, [facility]   SCK/MedQ  DD:  04/03/2006  DT:  04/03/2006  Job #:  [id number]   cc:   [phone] Dahlia Client, Medical Records at [facility]

## 2011-02-09 NOTE — H&P (Signed)
NAME:  YASSIN, SCALES NO.:  [id number]   MEDICAL RECORD NO.:  [id number]          PATIENT TYPE:  OIB   LOCATION:  1963                         FACILITY:  [facility]   PHYSICIAN:  Duke Salvia, M.D.  DATE OF BIRTH:  17-Apr-1946   DATE OF ADMISSION:  12/14/2005  DATE OF DISCHARGE:  12/14/2005                                HISTORY & PHYSICAL   HISTORY OF PRESENT ILLNESS:  Mr. Cart is a 65 year old gentleman who  underwent stress testing because of recent changes in functional capacity.  He also had accompanying neck discomfort.  While he had a negative  catheterization four years ago, and admitted for his treadmill testing, he  developed hypotension during exercise.  While the Myoview images were  unrevealing, the concern was that this could represent global ischemia, as  it is a very unusual reaction to exercise.  The patient had some concomitant  orthostasis and the possibility was also raised about a dysautonomic  process.   PAST MEDICAL HISTORY:  The patient has paroxysmal atrial fibrillation.   MEDICATIONS:  His medications included Coumadin, Norvasc, Benicar, Proscar,  Lipitor, Nexium.   PHYSICAL EXAMINATION:  VITAL SIGNS:  His pulse was 65, blood pressure  119/74.  LUNGS:  Clear.  HEART:  Sounds were regular.  EXTREMITIES:  Without edema.   IMPRESSION:  1.  Abnormal Cardiolite with hypotensive response to exercise.  2.  Paroxysmal atrial fibrillation.  3.  Atypical chest pain.   PLAN:  The patient was admitted for cardiac catheterization.           ______________________________  Duke Salvia, M.D.     SCK/MEDQ  D:  04/25/2006  T:  04/25/2006  Job:  [id number]

## 2011-02-09 NOTE — Assessment & Plan Note (Signed)
[name]                             PRIMARY CARE OFFICE NOTE   [name] [name], [name] [name]                     MRN:          [id number]  DATE:04/09/2006                            DOB:          12-04-1945    HISTORY OF PRESENT ILLNESS:  [name] [name] is a 65 year old gentleman who has  had a history recently of very difficult to control orthostatic hypotension.  He has had a very full evaluation by Dr. Berton [name] per Cardiology.  We  started him on multiple medications for his __________ and blood pressure.  The patient has had full evaluation by [name] [name] at [name],  Department of Cardiology and Electrophysiology.  Final diagnosis was that  the patient had relatively severe dysautonomia with orthostatic hypotension.  He has been started on Mestinon for treatment as well as having a  liberalized sodium intake.  This has resulted in marked improvement in his  orthostatic hypotension.   The patient had been seen by Dr. Alexis [name], and had been switched to  [name] [name] for BPH.  He noticed that he was having significant hypotension  with this, and made a decision to discontinue Norvasc and to discontinue  Benicar, continue him on Altace 10 mg daily.  His blood pressure records  from home shows excellent control with a low systolic in the 90's and a high  systolic in the 140+ range.  He reports he is actually feeling better and  doing better.   The patient was told in [name] that his hemoglobin A1C was 6.1%.  We  discussed this at length including the fact that this would in fact make him  diabetic by definition.  Explained the mechanism and value of A1C as opposed  to a two hour glucose tolerance test.  I discussed ways to intervene in  regards to lifestyle management given that at a hemoglobin A1C of 6.1% he  would not require medical intervention.  He is provided with both a pamphlet  on adult diabetes as well as a low carbohydrate or  carb-counting diet.  He  is committed to making dietary changes to improve his serum glucose.  Short  review was conducted, and reviewing his recent labs, he usually has had well  controlled serum glucose's running in the 98-114 range with very rare  episodes of elevations beyond that.   CURRENT MEDICATIONS:  1.  Coumadin as directed.  2.  Aspirin 81 mg daily.  3.  Multivitamin daily.  4.  Nexium 40 mg p.r.n.  5.  Fish oil daily.  6.  Proscar 5 mg daily.  7.  Lipitor 20 mg daily.  8.  Cod liver oil daily.  9.  Altace 10 mg daily.  10. Amiodarone 400 mg daily.  11. UroXatral daily.  12. Mestinon daily.   PHYSICAL EXAMINATION:  VITAL SIGNS:  Temperature 97.8, blood pressure  114/59, pulse 59, weight 233.  GENERAL APPEARANCE:  This is a tall, slender gentleman who is in no acute  distress.  No actual physical exam was conducted today given his complete  and thorough recent evaluations.  ASSESSMENT/PLAN:  1.  Cardiovascular.  Patient with dysautonomia with orthostatic hypotension,      currently doing much better with Mestinon, increased sodium in his diet,      and he has been able to reduce his medications significantly.  Plan is      for patient to continue on this regimen.  He is to follow up with Dr.      Rickard [name] as instructed and with Dr. Berton [name] as instructed.  2.  Diabetes.  Patient with hemoglobin A1C 6.1% by his report.  Information      provided as outlined above.  Plan to follow up hemoglobin A1C in 3-6      months.  Looking for improvement with dietary management.  3.  Immunization.  The patient is give Zostavax at today's visit.                                   [name] Gess Norins, MD   MEN/MedQ  DD:  04/09/2006  DT:  04/10/2006  Job #:  [id number]   cc:   [name] [name], M.D.  [name] Salvia, MD, [name]

## 2011-03-07 ENCOUNTER — Ambulatory Visit (INDEPENDENT_AMBULATORY_CARE_PROVIDER_SITE_OTHER): Payer: BC Managed Care – PPO | Admitting: *Deleted

## 2011-03-07 DIAGNOSIS — I4891 Unspecified atrial fibrillation: Secondary | ICD-10-CM

## 2011-03-07 LAB — POCT INR: INR: 2.5

## 2011-04-02 ENCOUNTER — Other Ambulatory Visit (INDEPENDENT_AMBULATORY_CARE_PROVIDER_SITE_OTHER): Payer: BC Managed Care – PPO

## 2011-04-02 DIAGNOSIS — I2510 Atherosclerotic heart disease of native coronary artery without angina pectoris: Secondary | ICD-10-CM

## 2011-04-02 LAB — LIPID PANEL
Cholesterol: 120 mg/dL (ref 0–200)
VLDL: 4.2 mg/dL (ref 0.0–40.0)

## 2011-04-02 LAB — HEPATIC FUNCTION PANEL
ALT: 14 U/L (ref 0–53)
Alkaline Phosphatase: 38 U/L — ABNORMAL LOW (ref 39–117)
Bilirubin, Direct: 0.2 mg/dL (ref 0.0–0.3)
Total Bilirubin: 0.8 mg/dL (ref 0.3–1.2)
Total Protein: 6.4 g/dL (ref 6.0–8.3)

## 2011-04-04 ENCOUNTER — Other Ambulatory Visit: Payer: Self-pay | Admitting: Internal Medicine

## 2011-04-04 ENCOUNTER — Ambulatory Visit (INDEPENDENT_AMBULATORY_CARE_PROVIDER_SITE_OTHER): Payer: BC Managed Care – PPO | Admitting: *Deleted

## 2011-04-04 ENCOUNTER — Encounter: Payer: Self-pay | Admitting: *Deleted

## 2011-04-04 DIAGNOSIS — I4891 Unspecified atrial fibrillation: Secondary | ICD-10-CM

## 2011-04-04 DIAGNOSIS — I2510 Atherosclerotic heart disease of native coronary artery without angina pectoris: Secondary | ICD-10-CM

## 2011-05-07 ENCOUNTER — Ambulatory Visit (INDEPENDENT_AMBULATORY_CARE_PROVIDER_SITE_OTHER): Payer: BC Managed Care – PPO | Admitting: *Deleted

## 2011-05-07 DIAGNOSIS — I4891 Unspecified atrial fibrillation: Secondary | ICD-10-CM

## 2011-05-07 LAB — POCT INR: INR: 2.7

## 2011-05-09 ENCOUNTER — Encounter: Payer: BC Managed Care – PPO | Admitting: *Deleted

## 2011-05-11 ENCOUNTER — Other Ambulatory Visit: Payer: Self-pay | Admitting: Internal Medicine

## 2011-06-04 ENCOUNTER — Ambulatory Visit (INDEPENDENT_AMBULATORY_CARE_PROVIDER_SITE_OTHER): Payer: BC Managed Care – PPO | Admitting: *Deleted

## 2011-06-04 DIAGNOSIS — I4891 Unspecified atrial fibrillation: Secondary | ICD-10-CM

## 2011-07-02 ENCOUNTER — Ambulatory Visit (INDEPENDENT_AMBULATORY_CARE_PROVIDER_SITE_OTHER): Payer: BC Managed Care – PPO | Admitting: *Deleted

## 2011-07-02 DIAGNOSIS — I4891 Unspecified atrial fibrillation: Secondary | ICD-10-CM

## 2011-07-02 LAB — POCT INR: INR: 2.4

## 2011-07-07 ENCOUNTER — Other Ambulatory Visit: Payer: Self-pay | Admitting: Internal Medicine

## 2011-07-30 ENCOUNTER — Encounter: Payer: BC Managed Care – PPO | Admitting: *Deleted

## 2011-07-31 ENCOUNTER — Ambulatory Visit (INDEPENDENT_AMBULATORY_CARE_PROVIDER_SITE_OTHER): Payer: BC Managed Care – PPO | Admitting: *Deleted

## 2011-07-31 ENCOUNTER — Telehealth: Payer: Self-pay | Admitting: *Deleted

## 2011-07-31 DIAGNOSIS — I4891 Unspecified atrial fibrillation: Secondary | ICD-10-CM

## 2011-07-31 LAB — POCT INR: INR: 2

## 2011-07-31 NOTE — Telephone Encounter (Signed)
I have changed my policy - all lab is ordered at the time of the visit to insure best medical practice.

## 2011-07-31 NOTE — Telephone Encounter (Signed)
Pt states that he came to [facility] lab fasting this morning at 7:30am to get his CPE labs drawn, but was told that there were no orders & that he would get them done after his physical [on 08/06/11] by Lab. Pt states he usually gets labs prior, please advise.

## 2011-08-01 NOTE — Telephone Encounter (Signed)
Left message on machine for pt to return my call  

## 2011-08-02 ENCOUNTER — Encounter: Payer: Self-pay | Admitting: Internal Medicine

## 2011-08-05 ENCOUNTER — Other Ambulatory Visit: Payer: Self-pay | Admitting: Internal Medicine

## 2011-08-06 ENCOUNTER — Other Ambulatory Visit (INDEPENDENT_AMBULATORY_CARE_PROVIDER_SITE_OTHER): Payer: BC Managed Care – PPO

## 2011-08-06 ENCOUNTER — Ambulatory Visit (INDEPENDENT_AMBULATORY_CARE_PROVIDER_SITE_OTHER): Payer: BC Managed Care – PPO | Admitting: Internal Medicine

## 2011-08-06 ENCOUNTER — Ambulatory Visit (INDEPENDENT_AMBULATORY_CARE_PROVIDER_SITE_OTHER)
Admission: RE | Admit: 2011-08-06 | Discharge: 2011-08-06 | Disposition: A | Discharge status: Home / Self Care | Payer: BC Managed Care – PPO | Source: Ambulatory Visit

## 2011-08-06 ENCOUNTER — Encounter: Payer: Self-pay | Admitting: Internal Medicine

## 2011-08-06 DIAGNOSIS — M818 Other osteoporosis without current pathological fracture: Secondary | ICD-10-CM

## 2011-08-06 DIAGNOSIS — E785 Hyperlipidemia, unspecified: Secondary | ICD-10-CM

## 2011-08-06 DIAGNOSIS — I2510 Atherosclerotic heart disease of native coronary artery without angina pectoris: Secondary | ICD-10-CM

## 2011-08-06 DIAGNOSIS — I10 Essential (primary) hypertension: Secondary | ICD-10-CM

## 2011-08-06 DIAGNOSIS — I4891 Unspecified atrial fibrillation: Secondary | ICD-10-CM

## 2011-08-06 DIAGNOSIS — Z87898 Personal history of other specified conditions: Secondary | ICD-10-CM

## 2011-08-06 DIAGNOSIS — Z0000 Encounter for general adult medical examination without abnormal findings: Secondary | ICD-10-CM

## 2011-08-06 DIAGNOSIS — F528 Other sexual dysfunction not due to a substance or known physiological condition: Secondary | ICD-10-CM

## 2011-08-06 DIAGNOSIS — M1710 Unilateral primary osteoarthritis, unspecified knee: Secondary | ICD-10-CM

## 2011-08-06 DIAGNOSIS — IMO0002 Reserved for concepts with insufficient information to code with codable children: Secondary | ICD-10-CM

## 2011-08-06 MED ORDER — ATORVASTATIN CALCIUM 20 MG PO TABS: 10.0000 mg | ORAL_TABLET | Freq: Every day | ORAL | Status: DC

## 2011-08-06 NOTE — Progress Notes (Signed)
Subjective:    [name], male    DOB: 01-Jun-1946, 65 y.o.   MRN: [id number]  HPI Mr. Savo presents for annual exam. He has been stable: heart rate is holding sinus rhythm since May '10 s/p ablation #2. He did mistep and has some discomfort right achilles tendon - just now resuming full exercise. He reports that ED is a greater problem. He has not had any major illness, no surgery and no major injury.   Past Medical History  Diagnosis Date  . DJD (degenerative joint disease)   . Leukopenia   . HTN (hypertension)   . CAD (coronary artery disease)   . COPD (chronic obstructive pulmonary disease)   . Osteoporosis   . Atrial fibrillation   . Cataract, nonsenile   . Finger, superficial foreign body (splinter), without major open wound, infected    Past Surgical History  Procedure Date  . Tonsillectomy   . Rf ablation for a. fib '09 - dr. Delena Serve, [place]. x 2   . Cataract extraction    Family History  Problem Relation Age of Onset  . Heart attack Father   . Coronary artery disease Father   . Transient ischemic attack Mother   . Coronary artery disease Mother     h/o TIA   . Stroke Mother   . Colon cancer Other     aunt and uncle   History   Social History  . Marital Status: Married    Spouse Name: N/A    Number of Children: 2  . Years of Education: 21   Occupational History  . ATTORNEY    Social History Main Topics  . Smoking status: Never Smoker   . Smokeless tobacco: Never Used  . Alcohol Use: Yes  . Drug Use: Not on file  . Sexually Active: Yes -- Male partner(s)   Other Topics Concern  . Not on file   Social History Narrative   [employer]; [employer]. Married '79. 2 daughters - '80, '82. His daughter's rowing team won the world championship in [place] '11. work: [employer]. Marriage in good health. Advanced Care Planning - discussed. He is aware of these issues. States he would not want futile care or prolonged  advanced heroic measures in the face of irreversible illness.        Review of Systems Constitutional:  Negative for fever, chills, activity change and unexpected weight change.  HEENT:  Negative for hearing loss, ear pain, congestion, neck stiffness and postnasal drip. Negative for sore throat or swallowing problems. Negative for dental complaints.   Eyes: Negative for vision loss or change in visual acuity.  Respiratory: Negative for chest tightness and wheezing. Negative for DOE.   Cardiovascular: Negative for chest pain or palpitations. No decreased exercise toleranceHolding sinus rhythm Gastrointestinal: No change in bowel habit. No bloating or gas. No reflux or indigestion Genitourinary: Negative for urgency, frequency, flank pain and difficulty urinating. Increased problem with ED Musculoskeletal: Negative for myalgias, back pain, arthralgias and gait problem.  Neurological: Negative for dizziness, tremors, weakness and headaches.  Hematological: Negative for adenopathy.  Psychiatric/Behavioral: Negative for behavioral problems and dysphoric mood.       Objective:   Physical Exam Vital signs reviewed-stable and normal Gen'l: Well nourished well developed tall white male in no acute distress  HEENT: Head: Normocephalic and atraumatic. Right Ear: External ear normal. EAC/TM nl. Left Ear: External ear normal.  EAC w/ cerumen. Nose: Nose normal. Mouth/Throat: Oropharynx is  clear and moist. Dentition - native, in good repair. No buccal or palatal lesions. Posterior pharynx clear. Eyes: Conjunctivae and sclera clear. EOM intact. Pupils are equal, round, and reactive to light. Right eye exhibits no discharge. Left eye exhibits no discharge. Neck: Normal range of motion. Neck supple. No JVD present. No tracheal deviation present. No thyromegaly present.  Cardiovascular: Normal rate, regular rhythm, no gallop, no friction rub, no murmur heard.      Quiet precordium. 2+ radial and DP pulses  . No carotid bruits Pulmonary/Chest: Effort normal. No respiratory distress or increased WOB, no wheezes, no rales. No chest wall deformity or CVAT. Abdominal: Soft. Bowel sounds are normal in all quadrants. He exhibits no distension, no tenderness, no rebound or guarding, No heptosplenomegaly  Genitourinary: deferred to Dr. Patsi Sears  Musculoskeletal: Normal range of motion. He exhibits no edema and no tenderness.       Small and large joints without redness, synovial thickening or deformity. Full range of motion preserved about all small, median and large joints. Mild hammer toe deformities. Small nodule on Right achilles tendon. Lymphadenopathy:    He has no cervical or supraclavicular adenopathy.  Neurological: He is alert and oriented to person, place, and time. CN II-XII intact. DTRs 2+ and symmetrical biceps, radial and patellar tendons. Cerebellar function normal with no tremor, rigidity, normal gait and station.  Skin: Skin is warm and dry. No rash noted. No erythema.  Psychiatric: He has a normal mood and affect. His behavior is normal. Thought content normal.   Lab Results  Component Value Date   WBC 2.9* 06/13/2010   HGB 15.5 06/13/2010   HCT 44.6 06/13/2010   PLT 174.0 06/13/2010   GLUCOSE 78 08/06/2011   CHOL 132 08/06/2011   TRIG 46.0 08/06/2011   HDL 57.90 08/06/2011   LDLCALC 65 08/06/2011   ALT 15 08/06/2011   ALT 15 08/06/2011   AST 28 08/06/2011   AST 28 08/06/2011   NA 139 08/06/2011   K 4.2 08/06/2011   CL 105 08/06/2011   CREATININE 0.9 08/06/2011   BUN 16 08/06/2011   CO2 26 08/06/2011   TSH 1.52 06/13/2010   PSA 0.33 06/13/2010   INR 2.0 07/31/2011           Assessment & Plan:

## 2011-08-07 LAB — COMPREHENSIVE METABOLIC PANEL
Albumin: 4.2 g/dL (ref 3.5–5.2)
BUN: 16 mg/dL (ref 6–23)
CO2: 26 mEq/L (ref 19–32)
Calcium: 8.8 mg/dL (ref 8.4–10.5)
Chloride: 105 mEq/L (ref 96–112)
Creatinine, Ser: 0.9 mg/dL (ref 0.4–1.5)
GFR: 87.58 mL/min (ref >60.00)
Glucose, Bld: 78 mg/dL (ref 70–99)
Potassium: 4.2 mEq/L (ref 3.5–5.1)

## 2011-08-07 LAB — LIPID PANEL
Cholesterol: 132 mg/dL (ref 0–200)
LDL Cholesterol: 65 mg/dL (ref 0–99)
Triglycerides: 46 mg/dL (ref 0.0–149.0)

## 2011-08-07 LAB — HEPATIC FUNCTION PANEL
Alkaline Phosphatase: 42 U/L (ref 39–117)
Bilirubin, Direct: 0.1 mg/dL (ref 0.0–0.3)
Total Bilirubin: 1 mg/dL (ref 0.3–1.2)
Total Protein: 6.7 g/dL (ref 6.0–8.3)

## 2011-08-07 NOTE — Telephone Encounter (Signed)
Pt has since been seen in office.

## 2011-08-09 ENCOUNTER — Encounter: Payer: Self-pay | Admitting: Internal Medicine

## 2011-08-09 DIAGNOSIS — Z0000 Encounter for general adult medical examination without abnormal findings: Secondary | ICD-10-CM | POA: Insufficient documentation

## 2011-08-09 NOTE — Assessment & Plan Note (Signed)
Stable and asymptomatic. He is followed closely by the cardiology service

## 2011-08-09 NOTE — Assessment & Plan Note (Signed)
BP Readings from Last 3 Encounters:  08/06/11 124/76  02/07/11 125/74  06/29/10 110/62   Excellent control of Blood pressure  Plan - continue present regimen

## 2011-08-09 NOTE — Assessment & Plan Note (Signed)
For DXA scanning with recommendations to follow.

## 2011-08-09 NOTE — Assessment & Plan Note (Signed)
He is treated for BPH with finesteride as well as Uraxatrol. He is noticing mild ED.   Plan- he will discuss with Dr. Patsi Sears the use of Cialis 5 mg daily for mgt of ED and BPH

## 2011-08-09 NOTE — Assessment & Plan Note (Signed)
See ED above - followed by Dr. Patsi Sears - doing well. Will discuss a change in medication with Dr. Patsi Sears.

## 2011-08-09 NOTE — Assessment & Plan Note (Signed)
Lab reveals excellent control with LDL better than goal of 80 or less. No adverse medication effects.  Plan - continue present regimen

## 2011-08-09 NOTE — Assessment & Plan Note (Signed)
Interval medical history is unremarkable w/o major illness. Physical exam is normal. Lab results are in normal range. He is current with Dr. Patsi Sears for prostate screening.He is current with colorectal cancer screening with next study due 2015. Immunizations are all up-to-date..  In summary- a very nice man with multiple medical problems who is generally stable. He will continue with his regular exercise program. He will discuss ED issues with Dr. Patsi Sears. He will follow with cardiology/EP as instructed. He will return as needed or in  1 year.

## 2011-08-09 NOTE — Assessment & Plan Note (Signed)
Followed closely by the EP service, Dr. Graciela Husbands. He has been holding sinus rhythm s/p ablation x 2. He is on warfarin but may change to "Equis" a product similar to Pradaxa with better side effect profile. Exam today reveals a sinus rhythm.

## 2011-08-09 NOTE — Assessment & Plan Note (Signed)
No active complaint today. He remains active but avoids high impact activities, i.e. Running. He may want to consider glucosamine for relief of discomfort w/ the understanding that it takes several months of use to see maximum benefit.

## 2011-08-20 ENCOUNTER — Ambulatory Visit (INDEPENDENT_AMBULATORY_CARE_PROVIDER_SITE_OTHER): Payer: BC Managed Care – PPO | Admitting: Sports Medicine

## 2011-08-20 ENCOUNTER — Encounter: Payer: Self-pay | Admitting: Sports Medicine

## 2011-08-20 VITALS — BP 127/75 | HR 89 | Ht 78.0 in | Wt 230.0 lb

## 2011-08-20 DIAGNOSIS — M7660 Achilles tendinitis, unspecified leg: Secondary | ICD-10-CM

## 2011-08-20 DIAGNOSIS — M6788 Other specified disorders of synovium and tendon, other site: Secondary | ICD-10-CM | POA: Insufficient documentation

## 2011-08-20 DIAGNOSIS — R269 Unspecified abnormalities of gait and mobility: Secondary | ICD-10-CM | POA: Insufficient documentation

## 2011-08-20 NOTE — Assessment & Plan Note (Addendum)
We will start with sports insoles with a heel lift on the right.  This correction in the leg length discrepancy should alleviate some of the pressure off of the right foot.  We have given him some extra lifts to put in his other shoes.  He will also do eccentric strength training for achilles rehab.

## 2011-08-20 NOTE — Progress Notes (Signed)
  Subjective:    [name], male    DOB: 06-04-1946, 65 y.o.   MRN: [id number]  HPI 65 y/o male is here with 2 questions.  The first one is regarding his right achilles tendon.  He started having pain and noticed swelling in the right achilles tendon after a vacation.  He doesn't recall a specific injury.  He was evaluated by ortho and treated with eccentric strength training and rest.  The pain has resolved but the nodule remains.  He is able to walk on the treadmill for an hour pain free.   He would like to know if it is safe for him to start playing golf even though the nodule hasn't resolved.  The surgeon has offered surgical resection of the nodule.  He also wonders if he needs orthotics for his feet.  A member of his walking group mentioned that he pronates while walking.  He has never had any foot or ankle issues.   Review of Systems     Objective:   Physical Exam  Hip internal rotation while sitting is 20 degrees on the left and 45 degrees on the right.  He gets 50 degrees of rotation on the left hip when supine.  The left leg is 1.5cm longer than the right leg  Both transverse arches are collapsing, right greater than left. There is hammering of the toes and splaying of the middle toes on both feet The right forefoot is widened compared to the left Posterior tibialis function is normal bilaterally  The achilles tendon has a 1xcm palpable nodule about 1cm from the insertion There is no tenderness to palpation, edema, or erythema about the achilles tendon  Gait: The left foot rotates inward during walking.  There is no significant pronation.   Ultrasound:  Right achilles tendon visualized in the long and short view.  Measures 0.46cm at the insertion.  Nodule measures 0.73cm.  There is no doppler activity at the nodule.  There is no sign of a split in the tendon.  There is a small amount of edema seen in the transverse view of the nodule.      Assessment &  Plan:

## 2011-08-20 NOTE — Assessment & Plan Note (Signed)
Longer left leg is causing increased pressure and abnormal push off of the right foot.  We have corrected this with a heel lift in the right shoe.  We will consider him for custom orthotics as this is a chronic problem.

## 2011-08-27 ENCOUNTER — Encounter: Payer: Self-pay | Admitting: Internal Medicine

## 2011-09-02 ENCOUNTER — Other Ambulatory Visit: Payer: Self-pay | Admitting: Internal Medicine

## 2011-09-03 ENCOUNTER — Telehealth: Payer: Self-pay | Admitting: Internal Medicine

## 2011-09-03 ENCOUNTER — Telehealth: Payer: Self-pay

## 2011-09-03 NOTE — Telephone Encounter (Signed)
New msg [facility] wants to clarify dosage and directions for warfarin

## 2011-09-03 NOTE — Telephone Encounter (Signed)
Has appointment on 09/04/2011

## 2011-09-03 NOTE — Telephone Encounter (Signed)
Telephoned pharmacy back.

## 2011-09-03 NOTE — Telephone Encounter (Signed)
Pt informed

## 2011-09-03 NOTE — Telephone Encounter (Signed)
Please call patient - DXA with mildly decreased t-score - but considered normal w/o evidence of osteopenia or osteoporosis

## 2011-09-04 ENCOUNTER — Ambulatory Visit (INDEPENDENT_AMBULATORY_CARE_PROVIDER_SITE_OTHER): Payer: BC Managed Care – PPO | Admitting: Internal Medicine

## 2011-09-04 ENCOUNTER — Ambulatory Visit (INDEPENDENT_AMBULATORY_CARE_PROVIDER_SITE_OTHER): Payer: BC Managed Care – PPO | Admitting: *Deleted

## 2011-09-04 ENCOUNTER — Encounter: Payer: Self-pay | Admitting: Internal Medicine

## 2011-09-04 VITALS — BP 117/69 | HR 81 | Ht 78.0 in | Wt 236.0 lb

## 2011-09-04 DIAGNOSIS — I4891 Unspecified atrial fibrillation: Secondary | ICD-10-CM

## 2011-09-04 NOTE — Progress Notes (Signed)
  HPI  [name] [name] is a 65 y.o. male  Seen in followup for atrial fibrillation for which he status post catheter ablation and a new [employer]. He also has coronary disease hypertension and has had presumptive problems with dysautonomia  He has done well.The patient denies chest pain, shortness of breath, nocturnal dyspnea, orthopnea or peripheral edema. There have been no palpitations, lightheadedness or syncope.  His LDL is 68. He would like to decrease his Lipitor  Past Medical History  Diagnosis Date  . DJD (degenerative joint disease)   . Leukopenia   . HTN (hypertension)   . CAD (coronary artery disease)   . COPD (chronic obstructive pulmonary disease)   . Osteoporosis   . Atrial fibrillation   . Cataract, nonsenile   . Finger, superficial foreign body (splinter), without major open wound, infected     Past Surgical History  Procedure Date  . Tonsillectomy   . Rf ablation for a. fib '09 - dr. Delena Serve, [place]. x 2   . Cataract extraction     Current Outpatient Prescriptions  Medication Sig Dispense Refill  . alfuzosin (UROXATRAL) 10 MG 24 hr tablet Take 10 mg by mouth daily.        Marland Kitchen atorvastatin (LIPITOR) 20 MG tablet Take 0.5 tablets (10 mg total) by mouth daily.  30 tablet  10  . calcium-vitamin D (OSCAL WITH D) 500-200 MG-UNIT per tablet 2 tabs po am and 1 tab po pm       . cloNIDine (CATAPRES) 0.1 MG tablet TAKE 1/2 TAB EVERY 2 HOURS FOR SBP 160 TO 180, AND 1 TAB EVERY 2 HOURS FOR SBP 180  60 tablet  1  . finasteride (PROSCAR) 5 MG tablet Take 5 mg by mouth daily.        . Flaxseed, Linseed, 1000 MG CAPS 1 tab po qd       . Multiple Vitamin (MULTIVITAMIN) capsule Take 1 capsule by mouth daily.        . Omega-3 Fatty Acids (FISH OIL) 1200 MG CAPS 1 tab po qd       . ramipril (ALTACE) 10 MG capsule Take 10 mg by mouth daily.        . TURMERIC PO 1 tab po qd       . vitamin B-12 (CYANOCOBALAMIN) 100 MCG tablet Take 50 mcg by mouth daily.        . Vitamins C E (CRANBERRY  CONCENTRATE PO) 1 tab po qd       . warfarin (COUMADIN) 5 MG tablet Take 1 tablet (5 mg total) by mouth as directed.  60 tablet  3    No Known Allergies  Review of Systems negative except from HPI and PMH  Physical Exam Well developed and well nourished in no acute distress HENT normal E scleral and icterus clear Neck Supple JVP flat; carotids brisk and full Clear to ausculation Regular rate and rhythm, no murmurs gallops or rub Soft with active bowel sounds No clubbing cyanosis none Edema Alert and oriented, grossly normal motor and sensory function Skin Warm and Dry   ECG shows sinus rhythm at 81 Intervals 0.20/0.09/0.37 Axis LXXV  Assessment and  Plan

## 2011-09-04 NOTE — Patient Instructions (Signed)
Your physician wants you to follow-up in: 6 months  You will receive a reminder letter in the mail two months in advance. If you don't receive a letter, please call our office to schedule the follow-up appointment.  Your physician recommends that you continue on your current medications as directed. Please refer to the Current Medication list given to you today.  

## 2011-10-16 ENCOUNTER — Ambulatory Visit (INDEPENDENT_AMBULATORY_CARE_PROVIDER_SITE_OTHER): Payer: BC Managed Care – PPO

## 2011-10-16 DIAGNOSIS — I4891 Unspecified atrial fibrillation: Secondary | ICD-10-CM

## 2011-10-16 LAB — POCT INR: INR: 2.1

## 2011-11-07 ENCOUNTER — Other Ambulatory Visit: Payer: Self-pay | Admitting: Internal Medicine

## 2011-11-19 ENCOUNTER — Ambulatory Visit (INDEPENDENT_AMBULATORY_CARE_PROVIDER_SITE_OTHER): Payer: BC Managed Care – PPO | Admitting: Pharmacist

## 2011-11-19 DIAGNOSIS — I4891 Unspecified atrial fibrillation: Secondary | ICD-10-CM

## 2011-11-19 LAB — POCT INR: INR: 2.3

## 2011-12-31 ENCOUNTER — Ambulatory Visit (INDEPENDENT_AMBULATORY_CARE_PROVIDER_SITE_OTHER): Payer: BC Managed Care – PPO | Admitting: Pharmacist

## 2011-12-31 DIAGNOSIS — I4891 Unspecified atrial fibrillation: Secondary | ICD-10-CM

## 2011-12-31 LAB — POCT INR: INR: 2.2

## 2012-01-13 ENCOUNTER — Other Ambulatory Visit: Payer: Self-pay | Admitting: Internal Medicine

## 2012-02-11 ENCOUNTER — Ambulatory Visit (INDEPENDENT_AMBULATORY_CARE_PROVIDER_SITE_OTHER): Payer: BC Managed Care – PPO

## 2012-02-11 DIAGNOSIS — I4891 Unspecified atrial fibrillation: Secondary | ICD-10-CM

## 2012-02-11 LAB — POCT INR: INR: 1.8

## 2012-03-11 ENCOUNTER — Ambulatory Visit (INDEPENDENT_AMBULATORY_CARE_PROVIDER_SITE_OTHER): Payer: BC Managed Care – PPO | Admitting: *Deleted

## 2012-03-11 DIAGNOSIS — I4891 Unspecified atrial fibrillation: Secondary | ICD-10-CM

## 2012-03-11 LAB — POCT INR: INR: 2

## 2012-04-11 ENCOUNTER — Ambulatory Visit (INDEPENDENT_AMBULATORY_CARE_PROVIDER_SITE_OTHER): Payer: BC Managed Care – PPO

## 2012-04-11 DIAGNOSIS — I4891 Unspecified atrial fibrillation: Secondary | ICD-10-CM

## 2012-05-09 ENCOUNTER — Ambulatory Visit (INDEPENDENT_AMBULATORY_CARE_PROVIDER_SITE_OTHER): Payer: BC Managed Care – PPO

## 2012-05-09 DIAGNOSIS — I4891 Unspecified atrial fibrillation: Secondary | ICD-10-CM

## 2012-05-20 ENCOUNTER — Other Ambulatory Visit: Payer: Self-pay | Admitting: Internal Medicine

## 2012-05-28 ENCOUNTER — Encounter: Payer: Self-pay | Admitting: Pharmacist

## 2012-06-05 ENCOUNTER — Ambulatory Visit (INDEPENDENT_AMBULATORY_CARE_PROVIDER_SITE_OTHER): Payer: BC Managed Care – PPO | Admitting: *Deleted

## 2012-06-05 DIAGNOSIS — I4891 Unspecified atrial fibrillation: Secondary | ICD-10-CM

## 2012-07-01 ENCOUNTER — Ambulatory Visit (INDEPENDENT_AMBULATORY_CARE_PROVIDER_SITE_OTHER): Payer: BC Managed Care – PPO | Admitting: *Deleted

## 2012-07-01 ENCOUNTER — Ambulatory Visit (INDEPENDENT_AMBULATORY_CARE_PROVIDER_SITE_OTHER): Payer: BC Managed Care – PPO | Admitting: General Practice

## 2012-07-01 DIAGNOSIS — I4891 Unspecified atrial fibrillation: Secondary | ICD-10-CM

## 2012-07-01 DIAGNOSIS — Z23 Encounter for immunization: Secondary | ICD-10-CM | POA: Insufficient documentation

## 2012-07-01 LAB — POCT INR: INR: 2.4

## 2012-07-29 ENCOUNTER — Ambulatory Visit (INDEPENDENT_AMBULATORY_CARE_PROVIDER_SITE_OTHER): Payer: BC Managed Care – PPO | Admitting: General Practice

## 2012-07-29 DIAGNOSIS — I4891 Unspecified atrial fibrillation: Secondary | ICD-10-CM

## 2012-07-29 LAB — POCT INR: INR: 2.1

## 2012-08-07 ENCOUNTER — Ambulatory Visit (INDEPENDENT_AMBULATORY_CARE_PROVIDER_SITE_OTHER): Payer: BC Managed Care – PPO | Admitting: Internal Medicine

## 2012-08-07 ENCOUNTER — Encounter: Payer: Self-pay | Admitting: Internal Medicine

## 2012-08-07 VITALS — BP 124/70 | HR 85 | Temp 97.6°F | Resp 12 | Ht 79.0 in | Wt 240.1 lb

## 2012-08-07 DIAGNOSIS — M949 Disorder of cartilage, unspecified: Secondary | ICD-10-CM

## 2012-08-07 DIAGNOSIS — E785 Hyperlipidemia, unspecified: Secondary | ICD-10-CM

## 2012-08-07 DIAGNOSIS — D72819 Decreased white blood cell count, unspecified: Secondary | ICD-10-CM

## 2012-08-07 DIAGNOSIS — M8580 Other specified disorders of bone density and structure, unspecified site: Secondary | ICD-10-CM

## 2012-08-07 DIAGNOSIS — I4891 Unspecified atrial fibrillation: Secondary | ICD-10-CM

## 2012-08-07 DIAGNOSIS — K219 Gastro-esophageal reflux disease without esophagitis: Secondary | ICD-10-CM

## 2012-08-07 DIAGNOSIS — Z0000 Encounter for general adult medical examination without abnormal findings: Secondary | ICD-10-CM

## 2012-08-07 DIAGNOSIS — I10 Essential (primary) hypertension: Secondary | ICD-10-CM

## 2012-08-07 DIAGNOSIS — IMO0002 Reserved for concepts with insufficient information to code with codable children: Secondary | ICD-10-CM

## 2012-08-07 DIAGNOSIS — G4733 Obstructive sleep apnea (adult) (pediatric): Secondary | ICD-10-CM

## 2012-08-07 DIAGNOSIS — M899 Disorder of bone, unspecified: Secondary | ICD-10-CM

## 2012-08-07 DIAGNOSIS — Z87898 Personal history of other specified conditions: Secondary | ICD-10-CM

## 2012-08-07 DIAGNOSIS — M1710 Unilateral primary osteoarthritis, unspecified knee: Secondary | ICD-10-CM

## 2012-08-07 DIAGNOSIS — I2510 Atherosclerotic heart disease of native coronary artery without angina pectoris: Secondary | ICD-10-CM

## 2012-08-07 DIAGNOSIS — F528 Other sexual dysfunction not due to a substance or known physiological condition: Secondary | ICD-10-CM

## 2012-08-07 NOTE — Patient Instructions (Addendum)
Thanks for coming to see me and for your assistance  Normal exam.  Full report to follow.  Lab results can be mailed or you can sign up for MyChart

## 2012-08-07 NOTE — Progress Notes (Signed)
Subjective:    [name], male    DOB: 06-24-1946, 66 y.o.   MRN: [id number]  HPI The patient is here for annual wellness examination and management of other chronic and acute problems. Generally well: no acute medical problems, surgery, injury. Has had floaters right eye.   The risk factors are reflected in the social history.  The roster of all physicians providing medical care to patient - is listed in the Snapshot section of the chart.  Activities of daily living:  The patient is 100% inedpendent in all ADLs: dressing, toileting, feeding as well as independent mobility  Home safety : The patient has smoke detectors in the home. Fall - no falls last year. Recommended home fall survey. They wear seatbelts. No firearms at home  There is no violence in the home.   There is no risks for hepatitis, STDs or HIV. There is no history of blood transfusion. They have no travel history to infectious disease endemic areas of the world.  The patient has  seen their dentist in the last six month. They have seen their eye doctor in the last year. They deny any hearing difficulty and have not had audiologic testing in the last year.    They do not  have excessive sun exposure. Discussed the need for sun protection: hats, long sleeves and use of sunscreen if there is significant sun exposure.   Diet: the importance of a healthy diet is discussed. They do have a healthy diet.  The patient has a regular exercise program: walking, weight training , 1.5 hrs duration,  7 per week.  The benefits of regular aerobic exercise were discussed.  Depression screen: there are no signs or vegative symptoms of depression- irritability, change in appetite, anhedonia, sadness/tearfullness.  Cognitive assessment: the patient manages all their financial and personal affairs and is actively engaged.   Past Medical History  Diagnosis Date  . DJD (degenerative joint disease)   . Leukopenia   . HTN  (hypertension)   . CAD (coronary artery disease)   . COPD (chronic obstructive pulmonary disease)   . Osteoporosis   . Atrial fibrillation   . Cataract, nonsenile   . Finger, superficial foreign body (splinter), without major open wound, infected    Past Surgical History  Procedure Date  . Tonsillectomy   . Rf ablation for a. fib '09 - dr. Delena Serve, [place]. x 2   . Cataract extraction    Family History  Problem Relation Age of Onset  . Heart attack Father   . Coronary artery disease Father   . Transient ischemic attack Mother   . Coronary artery disease Mother     h/o TIA   . Stroke Mother   . Colon cancer Other     aunt and uncle   History   Social History  . Marital Status: Married    Spouse Name: N/A    Number of Children: 2  . Years of Education: 16   Occupational History  . ATTORNEY    Social History Main Topics  . Smoking status: Never Smoker   . Smokeless tobacco: Never Used  . Alcohol Use: Yes  . Drug Use: No  . Sexually Active: Yes -- Male partner(s)   Other Topics Concern  . Not on file   Social History Narrative   [employer]; [employer]. Married '79. 2 daughters - '80, '82. His daughter's rowing team won the world championship in [place] '11. work: [employer]. Marriage in good health. Advanced Care Planning - discussed. He is aware of these issues. States he would not want futile care or prolonged advanced heroic measures in the face of irreversible illness.     Current Outpatient Prescriptions on File Prior to Visit  Medication Sig Dispense Refill  . alfuzosin (UROXATRAL) 10 MG 24 hr tablet Take 10 mg by mouth daily.        Marland Kitchen atorvastatin (LIPITOR) 20 MG tablet Take 0.5 tablets (10 mg total) by mouth daily.  30 tablet  10  . calcium-vitamin D (OSCAL WITH D) 500-200 MG-UNIT per tablet 2 tabs po am and 1 tab po pm       . finasteride (PROSCAR) 5 MG tablet Take 5 mg by mouth daily.        . Flaxseed, Linseed, 1000 MG CAPS 1  tab po qd       . Multiple Vitamin (MULTIVITAMIN) capsule Take 1 capsule by mouth daily.        . Omega-3 Fatty Acids (FISH OIL) 1200 MG CAPS 1 tab po qd       . ramipril (ALTACE) 10 MG capsule TAKE 2 CAPSULES BY MOUTH EVERY DAY  60 capsule  5  . TURMERIC PO 1 tab po qd       . vitamin B-12 (CYANOCOBALAMIN) 100 MCG tablet Take 50 mcg by mouth daily.        Marland Kitchen warfarin (COUMADIN) 5 MG tablet Take as directed by coumadin clinic  60 tablet  3  . cloNIDine (CATAPRES) 0.1 MG tablet TAKE 1/2 TAB EVERY 2 HOURS FOR SBP 160 TO 180, AND 1 TAB EVERY 2 HOURS FOR SBP 180  60 tablet  1   Vision, hearing, body mass index were assessed and reviewed.   During the course of the visit the patient was educated and counseled about appropriate screening and preventive services including : fall prevention , diabetes screening, nutrition counseling, colorectal cancer screening, and recommended immunizations.     Review of Systems Constitutional:  Negative for fever, chills, activity change and unexpected weight change.  HEENT:  Negative for hearing loss, ear pain, congestion, neck stiffness and postnasal drip. Negative for sore throat or swallowing problems. Negative for dental complaints.   Eyes: Negative for vision loss or change in visual acuity.  Respiratory: Negative for chest tightness and wheezing. Negative for DOE.   Cardiovascular: Negative for chest pain or palpitations. No decreased exercise tolerance Gastrointestinal: No change in bowel habit. No bloating or gas. No reflux or indigestion Genitourinary: Negative for urgency, frequency, flank pain and difficulty urinating.  Musculoskeletal: Negative for myalgias, back pain, arthralgias and gait problem.  Neurological: Negative for dizziness, tremors, weakness and headaches.  Hematological: Negative for adenopathy.  Psychiatric/Behavioral: Negative for behavioral problems and dysphoric mood.       Objective:   Physical Exam Filed Vitals:   08/07/12  1426  BP: 124/70  Pulse: 85  Temp: 97.6 F (36.4 C)  Resp: 12   Wt Readings from Last 3 Encounters:  08/07/12 240 lb 1.9 oz (108.918 kg)  09/04/11 236 lb (107.049 kg)  08/20/11 230 lb (104.327 kg)  BMI 27  Gen'l: Well nourished well developed, tall white male in no acute distress  HEENT: Head: Normocephalic and atraumatic. Right Ear: External ear normal. EAC/TM nl. Left Ear: External ear normal.  EAC/TM nl. Nose: Nose normal. Mouth/Throat: Oropharynx is clear and moist. Dentition - native, in good repair. No buccal or palatal lesions. Posterior pharynx clear. Eyes:  Conjunctivae and sclera clear. EOM intact. Pupils are equal, round, and reactive to light. Right eye exhibits no discharge. Left eye exhibits no discharge. Neck: Normal range of motion. Neck supple. No JVD present. No tracheal deviation present. No thyromegaly present.  Cardiovascular: Normal rate, regular rhythm, no gallop, no friction rub, no murmur heard.      Quiet precordium. 2+ radial and DP pulses . No carotid bruits Pulmonary/Chest: Effort normal. No respiratory distress or increased WOB, no wheezes, no rales. No chest wall deformity or CVAT. Abdomen: Soft. Bowel sounds are normal in all quadrants. He exhibits no distension, no tenderness, no rebound or guarding, No heptosplenomegaly  Genitourinary: deferred to Dr. Patsi Sears  Musculoskeletal: Normal range of motion. He exhibits no edema and no tenderness.       Small and large joints without redness, synovial thickening or deformity. Full range of motion preserved about all small, median and large joints.  Lymphadenopathy:    He has no cervical or supraclavicular adenopathy.  Neurological: He is alert and oriented to person, place, and time. CN II-XII intact. DTRs 2+ and symmetrical biceps, radial and patellar tendons. Cerebellar function normal with no tremor, rigidity, normal gait and station.  Skin: Skin is warm and dry. No rash noted. No erythema.  Psychiatric: He  has a normal mood and affect. His behavior is normal. Thought content normal.   Lab: pending           Assessment & Plan:

## 2012-08-08 NOTE — Assessment & Plan Note (Signed)
Sleep Study Feb '09 - AHI 4.8, respiratory disturbance index 18, symptomatic with somnolence Titration Study April '09 - did well with 13 mmH20 pressure Last follow up - September '10 Dr. Delton Coombes  Continues to do well with CPAP. Due for follow up with Dr. Delton Coombes

## 2012-08-08 NOTE — Assessment & Plan Note (Signed)
T-score Spine  T-score L hip  T-score R hip DEXA  June '10     -1.2      -1.7      -1.4 DEXA Nov '12      -0.9      -1.0      -1.0  Osteopenia improved from June '10 to Nov '12. Previous imaging does reveal patchy,mild calcification of aorta.  Plan Continue weight bearing exercise  Calcium requirement 1200 mg daily (diet + supplement)  Vitamin D 800-1,000 iu daily

## 2012-08-08 NOTE — Assessment & Plan Note (Signed)
Stable with no active complaints at today's visit 

## 2012-08-08 NOTE — Assessment & Plan Note (Signed)
Stable and holding sinus rhythm, remains asymptomatic. Last visit to Dr. Graciela Husbands Dec '12.  Plan - follow up with Dr. Graciela Husbands - will assist in scheduling follow up.

## 2012-08-08 NOTE — Assessment & Plan Note (Signed)
This problem is followed by Dr. Patsi Sears.

## 2012-08-08 NOTE — Assessment & Plan Note (Signed)
Chronic over many years and asymptomatic. No increased frequency of infection.

## 2012-08-08 NOTE — Assessment & Plan Note (Signed)
Interval history - doing well w/o major illness, surgery or injury. Physical exam, sans prostate, is normal. Labs are pending. He is current for colorectal cancer screening. Discussed pros and cons of prostate cancer screening (USPHCTF recommendations reviewed and ACU April '13 recommendations). He has seen Dr. Patsi Sears for exam and PSA. Immunizations are up to date.  In summary - a very nice man who appears to be medically stable. Will review labs when available. He is asked to return as needed or in 1 year.

## 2012-08-08 NOTE — Assessment & Plan Note (Signed)
Cardiac Cath March '07: mild non-obstructive disease: 20% LM, 40% mid LAD, 30% prox Cx, nl RCA. Followed on regular by cardiology. No complaints of cardiac symptoms at today's visit.

## 2012-08-08 NOTE — Assessment & Plan Note (Signed)
BP Readings from Last 3 Encounters:  08/07/12 124/70  09/04/11 117/69  08/20/11 127/75   Excellent control on Ramipril with only occasional need for clonidine  Plan- continue present regimen  Routine follow up lab: metabolic panel (electrolytes and renal function)

## 2012-08-08 NOTE — Assessment & Plan Note (Signed)
No limitation in activities including daily walking.

## 2012-08-08 NOTE — Assessment & Plan Note (Signed)
LDL 65, HDL 58 last year. Excellent control and tolerating "Statin" therapy without adverse effects.  Plan Continue present regimen  Due for follow up lab with recommendations to follow.

## 2012-08-08 NOTE — Assessment & Plan Note (Signed)
On proscar and uroxatrol with good results. No report of excessive nocturia or daytime symptoms  Plan  Continue present treatment  Follow-up with Dr. Patsi Sears as scheduled.

## 2012-08-22 ENCOUNTER — Ambulatory Visit: Payer: BC Managed Care – PPO | Admitting: Internal Medicine

## 2012-08-26 ENCOUNTER — Other Ambulatory Visit (INDEPENDENT_AMBULATORY_CARE_PROVIDER_SITE_OTHER): Payer: BC Managed Care – PPO

## 2012-08-26 ENCOUNTER — Ambulatory Visit (INDEPENDENT_AMBULATORY_CARE_PROVIDER_SITE_OTHER): Payer: BC Managed Care – PPO | Admitting: General Practice

## 2012-08-26 DIAGNOSIS — E785 Hyperlipidemia, unspecified: Secondary | ICD-10-CM

## 2012-08-26 DIAGNOSIS — I4891 Unspecified atrial fibrillation: Secondary | ICD-10-CM

## 2012-08-26 DIAGNOSIS — I10 Essential (primary) hypertension: Secondary | ICD-10-CM

## 2012-08-26 LAB — HEPATIC FUNCTION PANEL
ALT: 14 U/L (ref 0–53)
AST: 24 U/L (ref 0–37)
Albumin: 4.5 g/dL (ref 3.5–5.2)
Alkaline Phosphatase: 39 U/L (ref 39–117)
Total Protein: 7.1 g/dL (ref 6.0–8.3)

## 2012-08-26 LAB — LIPID PANEL
HDL: 54.7 mg/dL (ref >39.00)
LDL Cholesterol: 89 mg/dL (ref 0–99)
Total CHOL/HDL Ratio: 3
Triglycerides: 54 mg/dL (ref 0.0–149.0)

## 2012-08-26 LAB — COMPREHENSIVE METABOLIC PANEL
ALT: 14 U/L (ref 0–53)
AST: 24 U/L (ref 0–37)
Alkaline Phosphatase: 39 U/L (ref 39–117)
CO2: 30 mEq/L (ref 19–32)
Creatinine, Ser: 0.9 mg/dL (ref 0.4–1.5)
Sodium: 137 mEq/L (ref 135–145)
Total Bilirubin: 1.2 mg/dL (ref 0.3–1.2)
Total Protein: 7.1 g/dL (ref 6.0–8.3)

## 2012-08-26 LAB — POCT INR: INR: 2

## 2012-09-07 ENCOUNTER — Encounter: Payer: Self-pay | Admitting: Internal Medicine

## 2012-09-07 ENCOUNTER — Other Ambulatory Visit: Payer: Self-pay | Admitting: Internal Medicine

## 2012-09-08 ENCOUNTER — Other Ambulatory Visit: Payer: Self-pay | Admitting: *Deleted

## 2012-09-08 MED ORDER — ATORVASTATIN CALCIUM 20 MG PO TABS: 10.0000 mg | ORAL_TABLET | Freq: Every day | ORAL | Status: DC

## 2012-10-07 ENCOUNTER — Ambulatory Visit (INDEPENDENT_AMBULATORY_CARE_PROVIDER_SITE_OTHER): Payer: BC Managed Care – PPO | Admitting: General Practice

## 2012-10-07 DIAGNOSIS — I4891 Unspecified atrial fibrillation: Secondary | ICD-10-CM

## 2012-10-08 ENCOUNTER — Other Ambulatory Visit: Payer: Self-pay | Admitting: *Deleted

## 2012-10-08 MED ORDER — WARFARIN SODIUM 5 MG PO TABS: ORAL_TABLET | ORAL | Status: DC

## 2012-10-14 ENCOUNTER — Ambulatory Visit (INDEPENDENT_AMBULATORY_CARE_PROVIDER_SITE_OTHER): Payer: BC Managed Care – PPO | Admitting: Internal Medicine

## 2012-10-14 ENCOUNTER — Encounter: Payer: Self-pay | Admitting: Internal Medicine

## 2012-10-14 VITALS — BP 128/80 | HR 83 | Ht 78.0 in | Wt 247.2 lb

## 2012-10-14 DIAGNOSIS — I2510 Atherosclerotic heart disease of native coronary artery without angina pectoris: Secondary | ICD-10-CM

## 2012-10-14 DIAGNOSIS — I4891 Unspecified atrial fibrillation: Secondary | ICD-10-CM

## 2012-10-14 DIAGNOSIS — E785 Hyperlipidemia, unspecified: Secondary | ICD-10-CM

## 2012-10-14 NOTE — Assessment & Plan Note (Signed)
Holding sinus rhythm. We had a lengthy discussion regarding Coumadin versus NOACs. He would like to continue on warfarin at his INRs have been relatively stable.

## 2012-10-14 NOTE — Assessment & Plan Note (Signed)
No chest pain. No changes in exercise tolerance.

## 2012-10-14 NOTE — Progress Notes (Signed)
Patient Care Team: Jacques Navy, MD as PCP - General Kathi Ludwig, MD (Urology) Duke Salvia, MD (Cardiology) Rocco Serene, MD as Consulting Physician (Dermatology) Thera Flake., MD (Orthopedic Surgery) Enid Baas, MD (Family Medicine) Donnel Saxon, MD (Ophthalmology) Chucky May, MD (Ophthalmology)   HPI  [name] [name] is a 67 y.o. male Seen in followup for atrial fibrillation for which he status post catheter ablation at [facility]. He also has coronary disease hypertension and has had presumptive problems with dysautonomia  He has done well.The patient denies chest pain, shortness of breath, nocturnal dyspnea, orthopnea or peripheral edema. There have been no palpitations, lightheadedness or syncope.  His LDL is 68. He would like to decrease his Lipitor  The patient denies chest pain, shortness of breath, nocturnal dyspnea, orthopnea or peripheral edema.  There have been no palpitations, lightheadedness or syncope.    Past Medical History  Diagnosis Date  . DJD (degenerative joint disease)   . Leukopenia   . HTN (hypertension)   . CAD (coronary artery disease)   . COPD (chronic obstructive pulmonary disease)   . Osteoporosis   . Atrial fibrillation   . Cataract, nonsenile   . Finger, superficial foreign body (splinter), without major open wound, infected     Past Surgical History  Procedure Date  . Tonsillectomy   . Rf ablation for a. fib '09 - dr. Delena Serve, [place]. x 2   . Cataract extraction     Current Outpatient Prescriptions  Medication Sig Dispense Refill  . alfuzosin (UROXATRAL) 10 MG 24 hr tablet Take 10 mg by mouth daily.        Marland Kitchen atorvastatin (LIPITOR) 20 MG tablet Take 0.5 tablets (10 mg total) by mouth daily.  30 tablet  10  . calcium-vitamin D (OSCAL WITH D) 500-200 MG-UNIT per tablet 2 tabs po am and 1 tab po pm       . cholecalciferol (VITAMIN D) 1000 UNITS tablet Take 1,000 Units by mouth daily.      . cloNIDine (CATAPRES) 0.1 MG  tablet TAKE 1/2 TAB EVERY 2 HOURS FOR SBP 160 TO 180, AND 1 TAB EVERY 2 HOURS FOR SBP 180  60 tablet  1  . co-enzyme Q-10 30 MG capsule Take 30 mg by mouth daily.      . finasteride (PROSCAR) 5 MG tablet Take 5 mg by mouth daily.        . Flaxseed, Linseed, 1000 MG CAPS 1 tab po qd       . Multiple Vitamin (MULTIVITAMIN) capsule Take 1 capsule by mouth daily.        . Omega-3 Fatty Acids (FISH OIL) 1200 MG CAPS 1 tab po qd       . ramipril (ALTACE) 10 MG capsule TAKE 2 CAPSULES BY MOUTH EVERY DAY  60 capsule  5  . TURMERIC PO 1 tab po qd       . vitamin B-12 (CYANOCOBALAMIN) 100 MCG tablet Take 50 mcg by mouth daily.        Marland Kitchen warfarin (COUMADIN) 5 MG tablet Take as directed by coumadin clinic  60 tablet  3    No Known Allergies  Review of Systems negative except from HPI and PMH  Physical Exam BP 128/80  Pulse 83  Ht 6\' 6"  (1.981 m)  Wt 247 lb 3.2 oz (112.129 kg)  BMI 28.57 kg/m2 Well developed and nourished in no acute distress HENT normal Neck supple with JVP-flat Clear Regular rate  and rhythm, no murmurs or gallops Abd-soft with active BS No Clubbing cyanosis edema Skin-warm and dry A & Oriented  Grossly normal sensory and motor function  ECG demonstrates sinus rhythm at 81 Intervals 28/09/37 Axis is 75   Assessment and  Plan

## 2012-10-14 NOTE — Assessment & Plan Note (Addendum)
LDL most recently was 86.  Reasonable target

## 2012-10-21 ENCOUNTER — Ambulatory Visit (INDEPENDENT_AMBULATORY_CARE_PROVIDER_SITE_OTHER): Payer: BC Managed Care – PPO | Admitting: General Practice

## 2012-10-21 DIAGNOSIS — I4891 Unspecified atrial fibrillation: Secondary | ICD-10-CM

## 2012-11-06 ENCOUNTER — Encounter: Payer: BC Managed Care – PPO | Admitting: Sports Medicine

## 2012-11-11 ENCOUNTER — Ambulatory Visit (INDEPENDENT_AMBULATORY_CARE_PROVIDER_SITE_OTHER): Payer: BC Managed Care – PPO | Admitting: General Practice

## 2012-11-11 DIAGNOSIS — I4891 Unspecified atrial fibrillation: Secondary | ICD-10-CM

## 2012-12-02 ENCOUNTER — Encounter: Payer: Self-pay | Admitting: Sports Medicine

## 2012-12-02 ENCOUNTER — Ambulatory Visit (INDEPENDENT_AMBULATORY_CARE_PROVIDER_SITE_OTHER): Payer: BC Managed Care – PPO | Admitting: Sports Medicine

## 2012-12-02 VITALS — BP 120/70 | Ht 78.0 in | Wt 230.0 lb

## 2012-12-02 DIAGNOSIS — M25522 Pain in left elbow: Secondary | ICD-10-CM

## 2012-12-02 DIAGNOSIS — M2170 Unequal limb length (acquired), unspecified site: Secondary | ICD-10-CM | POA: Insufficient documentation

## 2012-12-02 DIAGNOSIS — M25529 Pain in unspecified elbow: Secondary | ICD-10-CM

## 2012-12-02 DIAGNOSIS — R269 Unspecified abnormalities of gait and mobility: Secondary | ICD-10-CM

## 2012-12-02 NOTE — Progress Notes (Signed)
  Subjective:    [name], male    DOB: 12-29-1945, 67 y.o.   MRN: [id number]  HPI  Follow up for orthotics for leg length discrepancy Last seen in 07/2011 for achilles tendon pain, which has resolved He continues to exercises 4-5 times per week Denies any foot pain at this time  LT elbow pain Started about 3-4 months ago Pain located medial aspect of elbow He picked up a heavy suitcase with LT arm and felt a sharp pain He tries not to do any heavy lifting due worsening pain   Review of Systems Per HPI    Objective:   Physical Exam  No acute distress  Hip rotation and strength have improved and he is very strong on hip abduction Right Achilles tendon nodule has resolved Right leg is about 1.5 cm short This does throw off his gait without correction  Feet reveal a very normal structure with moderate arch  Left elbow testing revealed some tenderness in the cubital tunnel elbow triceps tendon attachment to the left elbow He also gets slight pain with resisted wrist flexion and finger flexion on the medial aspect of the medial epicondyle        Assessment & Plan:

## 2012-12-02 NOTE — Assessment & Plan Note (Signed)
Lift was provided to use in his regular shoes  if possible wear the orthotics

## 2012-12-02 NOTE — Assessment & Plan Note (Signed)
Given a home exercise program to work on the triceps tendon and the flexor muscles that attach to the medial elbow  We will scan this on ultrasound if it persists and does not respond

## 2012-12-02 NOTE — Patient Instructions (Addendum)
For LT elbow pain/discomfort, please perform the following exercises at home: - Hammer curls with 5-10 lb weights.  Do 3 sets of 15 on each arm.  Do this 3-4 times per week. - Wrist curls with 3 lb weights.   Do 3 sets of 15 on each arm.  Do this 3-4 times per week. - Squeeze a stress ball 3-4 times per week to strengthen your finger flexors.  Wear your custom orthotics in all your shoes. If symptoms do not improve in 6-8 weeks, please return to clinic as needed.

## 2012-12-02 NOTE — Assessment & Plan Note (Signed)
Patient was fitted for a : standard, cushioned, semi-rigid orthotic. The orthotic was heated and afterward the patient stood on the orthotic blank positioned on the orthotic stand. The patient was positioned in subtalar neutral position and 10 degrees of ankle dorsiflexion in a weight bearing stance. After completion of molding, a stable base was applied to the orthotic blank. The blank was ground to a stable position for weight bearing. Size: 16 red EVA Base: blue med EVA Posting: RT orthotidc with second base to correct leg length difference Additional orthotic padding:  Gait is improved in the orthotics with less Trendelenburg He does have some femoral anteversion and intoeing  He should use these over the next 3 months and we will adjust if needed. Otherwise they should give him approximately 3 years of wear

## 2012-12-03 ENCOUNTER — Ambulatory Visit (INDEPENDENT_AMBULATORY_CARE_PROVIDER_SITE_OTHER): Payer: BC Managed Care – PPO | Admitting: General Practice

## 2012-12-03 DIAGNOSIS — I4891 Unspecified atrial fibrillation: Secondary | ICD-10-CM

## 2012-12-03 LAB — POCT INR: INR: 2.5

## 2012-12-28 ENCOUNTER — Other Ambulatory Visit: Payer: Self-pay | Admitting: Internal Medicine

## 2012-12-31 ENCOUNTER — Ambulatory Visit (INDEPENDENT_AMBULATORY_CARE_PROVIDER_SITE_OTHER): Payer: BC Managed Care – PPO | Admitting: General Practice

## 2012-12-31 DIAGNOSIS — I4891 Unspecified atrial fibrillation: Secondary | ICD-10-CM

## 2012-12-31 LAB — POCT INR: INR: 2

## 2013-01-23 ENCOUNTER — Ambulatory Visit (INDEPENDENT_AMBULATORY_CARE_PROVIDER_SITE_OTHER): Payer: BC Managed Care – PPO | Admitting: General Practice

## 2013-01-23 DIAGNOSIS — I4891 Unspecified atrial fibrillation: Secondary | ICD-10-CM

## 2013-02-03 ENCOUNTER — Other Ambulatory Visit: Payer: Self-pay | Admitting: *Deleted

## 2013-02-03 MED ORDER — WARFARIN SODIUM 5 MG PO TABS: ORAL_TABLET | ORAL | Status: DC

## 2013-02-18 ENCOUNTER — Ambulatory Visit (INDEPENDENT_AMBULATORY_CARE_PROVIDER_SITE_OTHER): Payer: BC Managed Care – PPO | Admitting: Family Medicine

## 2013-02-18 DIAGNOSIS — I4891 Unspecified atrial fibrillation: Secondary | ICD-10-CM

## 2013-02-18 LAB — POCT INR: INR: 2.2

## 2013-03-25 ENCOUNTER — Ambulatory Visit (INDEPENDENT_AMBULATORY_CARE_PROVIDER_SITE_OTHER): Payer: BC Managed Care – PPO | Admitting: General Practice

## 2013-03-25 DIAGNOSIS — I4891 Unspecified atrial fibrillation: Secondary | ICD-10-CM

## 2013-03-25 LAB — POCT INR: INR: 2.9

## 2013-04-22 ENCOUNTER — Ambulatory Visit (INDEPENDENT_AMBULATORY_CARE_PROVIDER_SITE_OTHER): Payer: BC Managed Care – PPO | Admitting: General Practice

## 2013-04-22 DIAGNOSIS — I4891 Unspecified atrial fibrillation: Secondary | ICD-10-CM

## 2013-04-22 LAB — POCT INR: INR: 2.6

## 2013-05-22 ENCOUNTER — Ambulatory Visit (INDEPENDENT_AMBULATORY_CARE_PROVIDER_SITE_OTHER): Payer: BC Managed Care – PPO | Admitting: Internal Medicine

## 2013-05-22 ENCOUNTER — Encounter: Payer: Self-pay | Admitting: Internal Medicine

## 2013-05-22 ENCOUNTER — Ambulatory Visit (INDEPENDENT_AMBULATORY_CARE_PROVIDER_SITE_OTHER): Payer: BC Managed Care – PPO | Admitting: Pharmacist

## 2013-05-22 VITALS — BP 133/81 | HR 83 | Ht 78.0 in | Wt 243.8 lb

## 2013-05-22 DIAGNOSIS — I4891 Unspecified atrial fibrillation: Secondary | ICD-10-CM

## 2013-05-22 DIAGNOSIS — I2510 Atherosclerotic heart disease of native coronary artery without angina pectoris: Secondary | ICD-10-CM

## 2013-05-22 NOTE — Progress Notes (Signed)
Patient Care Team: Jacques Navy, MD as PCP - General Kathi Ludwig, MD (Urology) Duke Salvia, MD (Cardiology) Rocco Serene, MD as Consulting Physician (Dermatology) Thera Flake., MD (Orthopedic Surgery) Enid Baas, MD (Family Medicine) Donnel Saxon, MD (Ophthalmology) Chucky May, MD (Ophthalmology)   HPI  [name] [name] is a 67 y.o. male Seen in followup for atrial fibrillation for which he status post catheter ablation at [facility]. He also has coronary disease hypertension and has had presumptive problems with dysautonomia  He has been treated with warfarin as opposed to the NOACs  He has done well.The patient denies chest pain, shortness of breath, nocturnal dyspnea, orthopnea or peripheral edema. There have been no palpitations, lightheadedness or syncope.       Past Medical History  Diagnosis Date  . DJD (degenerative joint disease)   . Leukopenia   . HTN (hypertension)   . CAD (coronary artery disease)   . COPD (chronic obstructive pulmonary disease)   . Osteoporosis   . Atrial fibrillation   . Cataract, nonsenile   . Finger, superficial foreign body (splinter), without major open wound, infected     Past Surgical History  Procedure Laterality Date  . Tonsillectomy    . Rf ablation for a. fib '09 - dr. Delena Serve, [place]. x 2    . Cataract extraction      Current Outpatient Prescriptions  Medication Sig Dispense Refill  . alfuzosin (UROXATRAL) 10 MG 24 hr tablet Take 10 mg by mouth daily.        Marland Kitchen atorvastatin (LIPITOR) 20 MG tablet Take 0.5 tablets (10 mg total) by mouth daily.  30 tablet  10  . calcium-vitamin D (OSCAL WITH D) 500-200 MG-UNIT per tablet 2 tabs po am and 1 tab po pm       . cholecalciferol (VITAMIN D) 1000 UNITS tablet Take 1,000 Units by mouth daily.      . cloNIDine (CATAPRES) 0.1 MG tablet TAKE 1/2 TAB EVERY 2 HOURS FOR SBP 160 TO 180, AND 1 TAB EVERY 2 HOURS FOR SBP 180  60 tablet  1  . co-enzyme Q-10 30 MG capsule Take 30 mg  by mouth daily.      . finasteride (PROSCAR) 5 MG tablet Take 5 mg by mouth daily.        . Flaxseed, Linseed, 1000 MG CAPS 1 tab po qd       . Multiple Vitamin (MULTIVITAMIN) capsule Take 1 capsule by mouth daily.        . Omega-3 Fatty Acids (FISH OIL) 1200 MG CAPS 1 tab po qd       . ramipril (ALTACE) 10 MG capsule take 2 capsules by mouth once daily  60 capsule  PRN  . vitamin B-12 (CYANOCOBALAMIN) 100 MCG tablet Take 50 mcg by mouth daily.        Marland Kitchen warfarin (COUMADIN) 5 MG tablet Take as directed by coumadin clinic  60 tablet  3   No current facility-administered medications for this visit.    No Known Allergies  Review of Systems negative except from HPI and PMH  Physical Exam BP 133/81  Pulse 83  Ht 6\' 6"  (1.981 m)  Wt 243 lb 12.8 oz (110.587 kg)  BMI 28.18 kg/m2 Well developed and nourished in no acute distress HENT normal Neck supple with JVP-flat Clear Regular rate and rhythm, no murmurs or gallops Abd-soft with active BS No Clubbing cyanosis edema Skin-warm and dry A & Oriented  Grossly normal sensory and motor function\    Assessment and  Plan

## 2013-05-22 NOTE — Assessment & Plan Note (Signed)
Stable

## 2013-05-22 NOTE — Patient Instructions (Addendum)
The current medical regimen is effective;  continue present plan and medications.  Follow up in 6 monhts with Dr Graciela Husbands.  You will receive a letter in the mail 2 months before you are due.  Please call us when you receive this letter to schedule your follow up appointment.

## 2013-05-22 NOTE — Assessment & Plan Note (Signed)
Occasional paroxysms. He thinks that they are provoked by GI issues i.e. Constipation. We'll lengthy discussion regarding the hypothesis of daily mediated atrial fibrillation in the concerns about it being aggravated by beta blocker therapy. We also discussed anticoagulation. He would like to stay on warfarin.

## 2013-06-08 ENCOUNTER — Other Ambulatory Visit: Payer: Self-pay | Admitting: *Deleted

## 2013-06-08 MED ORDER — WARFARIN SODIUM 5 MG PO TABS: ORAL_TABLET | ORAL | Status: DC

## 2013-06-17 ENCOUNTER — Ambulatory Visit (INDEPENDENT_AMBULATORY_CARE_PROVIDER_SITE_OTHER): Payer: BC Managed Care – PPO | Admitting: General Practice

## 2013-06-17 DIAGNOSIS — I4891 Unspecified atrial fibrillation: Secondary | ICD-10-CM

## 2013-07-17 ENCOUNTER — Ambulatory Visit (INDEPENDENT_AMBULATORY_CARE_PROVIDER_SITE_OTHER): Payer: BC Managed Care – PPO | Admitting: Family Medicine

## 2013-07-17 DIAGNOSIS — I4891 Unspecified atrial fibrillation: Secondary | ICD-10-CM

## 2013-07-17 LAB — POCT INR: INR: 2.3

## 2013-08-12 ENCOUNTER — Other Ambulatory Visit: Payer: Self-pay | Admitting: Internal Medicine

## 2013-08-12 DIAGNOSIS — M8580 Other specified disorders of bone density and structure, unspecified site: Secondary | ICD-10-CM

## 2013-08-14 ENCOUNTER — Ambulatory Visit (INDEPENDENT_AMBULATORY_CARE_PROVIDER_SITE_OTHER): Payer: BC Managed Care – PPO | Admitting: General Practice

## 2013-08-14 DIAGNOSIS — I4891 Unspecified atrial fibrillation: Secondary | ICD-10-CM

## 2013-08-14 LAB — POCT INR: INR: 2.3

## 2013-08-14 NOTE — Progress Notes (Signed)
Pre-visit discussion using our clinic review tool. No additional management support is needed unless otherwise documented below in the visit note.  

## 2013-08-17 ENCOUNTER — Ambulatory Visit (INDEPENDENT_AMBULATORY_CARE_PROVIDER_SITE_OTHER)
Admission: RE | Admit: 2013-08-17 | Discharge: 2013-08-17 | Disposition: A | Discharge status: Home / Self Care | Payer: BC Managed Care – PPO | Source: Ambulatory Visit | Attending: Internal Medicine | Admitting: Internal Medicine

## 2013-08-17 DIAGNOSIS — M8580 Other specified disorders of bone density and structure, unspecified site: Secondary | ICD-10-CM

## 2013-08-17 DIAGNOSIS — M899 Disorder of bone, unspecified: Secondary | ICD-10-CM

## 2013-08-21 ENCOUNTER — Encounter: Payer: Self-pay | Admitting: Internal Medicine

## 2013-09-09 ENCOUNTER — Ambulatory Visit (INDEPENDENT_AMBULATORY_CARE_PROVIDER_SITE_OTHER): Payer: BC Managed Care – PPO | Admitting: General Practice

## 2013-09-09 DIAGNOSIS — I4891 Unspecified atrial fibrillation: Secondary | ICD-10-CM

## 2013-09-09 LAB — POCT INR: INR: 2.8

## 2013-09-09 NOTE — Progress Notes (Signed)
Pre-visit discussion using our clinic review tool. No additional management support is needed unless otherwise documented below in the visit note.  

## 2013-10-07 ENCOUNTER — Ambulatory Visit (INDEPENDENT_AMBULATORY_CARE_PROVIDER_SITE_OTHER): Payer: BC Managed Care – PPO | Admitting: General Practice

## 2013-10-07 ENCOUNTER — Other Ambulatory Visit: Payer: Self-pay | Admitting: *Deleted

## 2013-10-07 DIAGNOSIS — I4891 Unspecified atrial fibrillation: Secondary | ICD-10-CM

## 2013-10-07 LAB — POCT INR: INR: 2.6

## 2013-10-07 MED ORDER — WARFARIN SODIUM 5 MG PO TABS: ORAL_TABLET | ORAL | Status: DC

## 2013-10-07 NOTE — Progress Notes (Signed)
Pre-visit discussion using our clinic review tool. No additional management support is needed unless otherwise documented below in the visit note.  

## 2013-11-04 ENCOUNTER — Ambulatory Visit (INDEPENDENT_AMBULATORY_CARE_PROVIDER_SITE_OTHER): Payer: BC Managed Care – PPO | Admitting: General Practice

## 2013-11-04 DIAGNOSIS — I4891 Unspecified atrial fibrillation: Secondary | ICD-10-CM

## 2013-11-04 DIAGNOSIS — Z5181 Encounter for therapeutic drug level monitoring: Secondary | ICD-10-CM

## 2013-11-04 LAB — POCT INR: INR: 2.1

## 2013-11-04 NOTE — Progress Notes (Signed)
Pre-visit discussion using our clinic review tool. No additional management support is needed unless otherwise documented below in the visit note.  

## 2013-11-09 ENCOUNTER — Ambulatory Visit (INDEPENDENT_AMBULATORY_CARE_PROVIDER_SITE_OTHER): Payer: BC Managed Care – PPO | Admitting: Internal Medicine

## 2013-11-09 ENCOUNTER — Other Ambulatory Visit (INDEPENDENT_AMBULATORY_CARE_PROVIDER_SITE_OTHER): Payer: BC Managed Care – PPO

## 2013-11-09 ENCOUNTER — Encounter: Payer: Self-pay | Admitting: Internal Medicine

## 2013-11-09 VITALS — BP 140/80 | HR 90 | Temp 97.0°F | Ht 78.0 in | Wt 245.0 lb

## 2013-11-09 DIAGNOSIS — E785 Hyperlipidemia, unspecified: Secondary | ICD-10-CM

## 2013-11-09 DIAGNOSIS — M1710 Unilateral primary osteoarthritis, unspecified knee: Secondary | ICD-10-CM

## 2013-11-09 DIAGNOSIS — Z0000 Encounter for general adult medical examination without abnormal findings: Secondary | ICD-10-CM

## 2013-11-09 DIAGNOSIS — M949 Disorder of cartilage, unspecified: Secondary | ICD-10-CM

## 2013-11-09 DIAGNOSIS — M8580 Other specified disorders of bone density and structure, unspecified site: Secondary | ICD-10-CM

## 2013-11-09 DIAGNOSIS — I10 Essential (primary) hypertension: Secondary | ICD-10-CM

## 2013-11-09 DIAGNOSIS — IMO0002 Reserved for concepts with insufficient information to code with codable children: Secondary | ICD-10-CM

## 2013-11-09 DIAGNOSIS — Z87898 Personal history of other specified conditions: Secondary | ICD-10-CM

## 2013-11-09 DIAGNOSIS — D72819 Decreased white blood cell count, unspecified: Secondary | ICD-10-CM

## 2013-11-09 DIAGNOSIS — I4891 Unspecified atrial fibrillation: Secondary | ICD-10-CM

## 2013-11-09 DIAGNOSIS — T50995A Adverse effect of other drugs, medicaments and biological substances, initial encounter: Secondary | ICD-10-CM

## 2013-11-09 DIAGNOSIS — M899 Disorder of bone, unspecified: Secondary | ICD-10-CM

## 2013-11-09 DIAGNOSIS — I2510 Atherosclerotic heart disease of native coronary artery without angina pectoris: Secondary | ICD-10-CM

## 2013-11-09 DIAGNOSIS — K219 Gastro-esophageal reflux disease without esophagitis: Secondary | ICD-10-CM

## 2013-11-09 DIAGNOSIS — Z7189 Other specified counseling: Secondary | ICD-10-CM

## 2013-11-09 DIAGNOSIS — Z23 Encounter for immunization: Secondary | ICD-10-CM

## 2013-11-09 LAB — LIPID PANEL
CHOLESTEROL: 160 mg/dL (ref 0–200)
HDL: 56.6 mg/dL (ref >39.00)
LDL Cholesterol: 92 mg/dL (ref 0–99)
Total CHOL/HDL Ratio: 3
Triglycerides: 57 mg/dL (ref 0.0–149.0)
VLDL: 11.4 mg/dL (ref 0.0–40.0)

## 2013-11-09 LAB — COMPREHENSIVE METABOLIC PANEL
ALT: 14 U/L (ref 0–53)
AST: 29 U/L (ref 0–37)
Albumin: 4.6 g/dL (ref 3.5–5.2)
Alkaline Phosphatase: 38 U/L — ABNORMAL LOW (ref 39–117)
BUN: 15 mg/dL (ref 6–23)
CO2: 25 meq/L (ref 19–32)
Calcium: 9.1 mg/dL (ref 8.4–10.5)
Chloride: 105 mEq/L (ref 96–112)
Creatinine, Ser: 0.9 mg/dL (ref 0.4–1.5)
GFR: 89.22 mL/min (ref >60.00)
GLUCOSE: 100 mg/dL — AB (ref 70–99)
Potassium: 4.1 mEq/L (ref 3.5–5.1)
Sodium: 138 mEq/L (ref 135–145)
TOTAL PROTEIN: 7.5 g/dL (ref 6.0–8.3)
Total Bilirubin: 0.8 mg/dL (ref 0.3–1.2)

## 2013-11-09 LAB — HEMOGLOBIN A1C: Hgb A1c MFr Bld: 5.8 % (ref 4.6–6.5)

## 2013-11-09 LAB — CBC WITH DIFFERENTIAL/PLATELET
Basophils Absolute: 0 10*3/uL (ref 0.0–0.1)
Basophils Relative: 0.5 % (ref 0.0–3.0)
EOS PCT: 1.6 % (ref 0.0–5.0)
Eosinophils Absolute: 0.1 10*3/uL (ref 0.0–0.7)
HEMATOCRIT: 48.3 % (ref 39.0–52.0)
HEMOGLOBIN: 15.9 g/dL (ref 13.0–17.0)
LYMPHS ABS: 1.2 10*3/uL (ref 0.7–4.0)
Lymphocytes Relative: 24.3 % (ref 12.0–46.0)
MCHC: 32.8 g/dL (ref 30.0–36.0)
MCV: 91.6 fl (ref 78.0–100.0)
MONO ABS: 0.5 10*3/uL (ref 0.1–1.0)
MONOS PCT: 10.2 % (ref 3.0–12.0)
NEUTROS ABS: 3 10*3/uL (ref 1.4–7.7)
Neutrophils Relative %: 63.4 % (ref 43.0–77.0)
PLATELETS: 195 10*3/uL (ref 150.0–400.0)
RBC: 5.27 Mil/uL (ref 4.22–5.81)
RDW: 13.8 % (ref 11.5–14.6)
WBC: 4.8 10*3/uL (ref 4.5–10.5)

## 2013-11-09 NOTE — Patient Instructions (Signed)
Good to see you. In regard to primary care - [name] [name] is great, [name] [name] is great but does not go to hospital. In this office [name] [name] or [name] [name] will be great.   Your exam is good. Routine lab today including cholesterol and white blood count. Results will posted to MyChart   Immunizations - current with shingles, tetanus due in 2023; Pneumovax booster due in 2016; Prevnar 13 -a companion pneumonia vaccine - today, once a done.   Due for colonoscopy this year - can change to [name] [name].

## 2013-11-09 NOTE — Progress Notes (Signed)
Subjective:    [name], male    DOB: Feb 18, 1946, 68 y.o.   MRN: [id number]  HPI [name] [name] presents for an annual medical wellness exam. He has been doing fine but does report a recent history of right testicular tenderness. He denies any strain or injury or prior problems with GU issues. Otherwise he has been doing well. He is current with cardiology follow up. Since his second ablation procedure he reports that he has maintained sinus rhythm except for one episode of a. Fib with RVR that he associated with constipation and subsequent strain. He took Miralax with good results and has not had any additional episodes.  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 He reports that he does have greater flexibility in his schedule at this point. Home life is good although he expresses concern about his daughter in [place] - possible depressive episode related to her training and performance as a [name] [name].   He is current with colorectal cancer screening - due in Fall '15 for 10 year follow-up. He sees his dental care provider on a regular basis and he is due for ophthal exam. He does continue to exercise with approximately 1 hr of cardio-fitness 4 days a week plus additional strengthening exercise. His weight has been stable. He has a healthy diet.  Past Medical History  Diagnosis Date  . DJD (degenerative joint disease)   . Leukopenia   . HTN (hypertension)   . CAD (coronary artery disease)   . COPD (chronic obstructive pulmonary disease)   . Osteoporosis   . Atrial fibrillation   . Cataract, nonsenile   . Finger, superficial foreign body (splinter), without major open wound, infected    Past Surgical History  Procedure Laterality Date  . Tonsillectomy    . Rf ablation for a. fib '09 - dr. Delena [name], [name] [name]. x 2    . Cataract extraction     Family History  Problem Relation Age of Onset  . Heart attack Father   . Coronary  artery disease Father   . Transient ischemic attack Mother   . Coronary artery disease Mother     h/o TIA   . Stroke Mother   . Colon cancer Other     aunt and uncle   History   Social History  . Marital Status: Married    Spouse Name: N/A    Number of Children: 2  . Years of Education: 86   Occupational History  . [name] [name]    Social History Main Topics  . Smoking status: Never Smoker   . Smokeless tobacco: Never Used  . Alcohol Use: Yes  . Drug Use: No  . Sexual Activity: Yes    Partners: Female   Other Topics Concern  . Not on file   Social History Narrative   [name] [name]; [name] [name]. Married '79. 2 daughters - '80, '82. His daughter's rowing team won the [name] [name] in [name] [name] '11. work: [name] [name]. Marriage in good [name]. Advanced Care Planning - discussed. He is aware of these issues. States he would not want futile care or prolonged advanced heroic measures in the face of irreversible illness.                 Current Outpatient Prescriptions on File Prior to Visit  Medication Sig Dispense Refill  . alfuzosin (UROXATRAL) 10 MG 24 hr tablet Take 10 mg  by mouth daily.        Marland Kitchen atorvastatin (LIPITOR) 20 MG tablet Take 0.5 tablets (10 mg total) by mouth daily.  30 tablet  10  . calcium-vitamin D (OSCAL WITH D) 500-200 MG-UNIT per tablet 2 tabs po am and 1 tab po pm       . cholecalciferol (VITAMIN D) 1000 UNITS tablet Take 1,000 Units by mouth daily.      . cloNIDine (CATAPRES) 0.1 MG tablet TAKE 1/2 TAB EVERY 2 HOURS FOR SBP 160 TO 180, AND 1 TAB EVERY 2 HOURS FOR SBP 180  60 tablet  1  . co-enzyme Q-10 30 MG capsule Take 30 mg by mouth daily.      . finasteride (PROSCAR) 5 MG tablet Take 5 mg by mouth daily.        . Flaxseed, Linseed, 1000 MG CAPS 1 tab po qd       . Multiple Vitamin (MULTIVITAMIN) capsule Take 1 capsule by mouth daily.        . Omega-3 Fatty Acids (FISH OIL) 1200 MG CAPS 1 tab po qd       . ramipril  (ALTACE) 10 MG capsule take 2 capsules by mouth once daily  60 capsule  PRN  . vitamin B-12 (CYANOCOBALAMIN) 100 MCG tablet Take 50 mcg by mouth daily.        . Vitamins C E (CRANBERRY CONCENTRATE PO) Take 1 tablet by mouth.      . warfarin (COUMADIN) 5 MG tablet Take as directed by coumadin clinic  65 tablet  3   No current facility-administered medications on file prior to visit.      Review of Systems Constitutional:  Negative for fever, chills, activity change and unexpected weight change.  HEENT:  Negative for hearing loss, ear pain, congestion, neck stiffness and postnasal drip. Negative for sore throat or swallowing problems. Negative for dental complaints.   Eyes: Negative for vision loss or change in visual acuity.  Respiratory: Negative for chest tightness and wheezing. Negative for DOE.   Cardiovascular: Negative for chest pain or palpitations. No decreased exercise tolerance Gastrointestinal: No change in bowel habit other than recent problem with constipation now resolved. No bloating or gas. No reflux or indigestion Genitourinary: Negative for urgency, frequency, flank pain and difficulty urinating.  Musculoskeletal: Negative for myalgias, back pain, arthralgias and gait problem.  Neurological: Negative for dizziness, tremors, weakness and headaches.  Hematological: Negative for adenopathy.  Psychiatric/Behavioral: Negative for behavioral problems and dysphoric mood.       Objective:   Physical Exam Filed Vitals:   11/09/13 1337  BP: 140/80  Pulse: 90  Temp: 97 F (36.1 C)   Wt Readings from Last 3 Encounters:  11/09/13 245 lb (111.131 kg)  05/22/13 243 lb 12.8 oz (110.587 kg)  12/02/12 230 lb (104.327 kg)   Body mass index is 28.32 kg/(m^2).  Gen'l: Well nourished well developed male in no acute distress  HEENT: Head: Normocephalic and atraumatic. Right Ear: External ear normal. EAC/TM nl. Left Ear: External ear normal.  EAC/TM nl. Nose: Nose normal.  Mouth/Throat: Oropharynx is clear and moist. Dentition - native, in good repair. No buccal or palatal lesions. Posterior pharynx clear. Eyes: Conjunctivae and sclera clear. EOM intact. Pupils are equal, round, and reactive to light. Right eye exhibits no discharge. Left eye exhibits no discharge. Neck: Normal range of motion. Neck supple. No JVD present. No tracheal deviation present. No thyromegaly present.  Cardiovascular: Normal rate, regular rhythm, no gallop, no  friction rub, no murmur heard.      Quiet precordium. 2+ radial and DP pulses . No carotid bruits Pulmonary/Chest: Effort normal. No respiratory distress or increased WOB, no wheezes, no rales. No chest wall deformity or CVAT. Abdomen: Soft. Bowel sounds are normal in all quadrants. He exhibits no distension, no tenderness, no rebound or guarding, No heptosplenomegaly  Genitourinary:  normal shaft. Bilaterally descended testicles w/o scrotal mass, w/o epididymal tenderness right. Musculoskeletal: Normal range of motion. He exhibits no edema and no tenderness.       Small and large joints without redness, synovial thickening or deformity. Full range of motion preserved about all small, median and large joints.  Lymphadenopathy:    He has no cervical or supraclavicular adenopathy.  Neurological: He is alert and oriented to person, place, and time. CN II-XII intact. DTRs 2+ and symmetrical biceps, radial and patellar tendons. Cerebellar function normal with no tremor, rigidity, normal gait and station.  Skin: Skin is warm and dry. No rash noted. No erythema.  Psychiatric: He has a normal mood and affect. His behavior is normal. Thought content normal.   Recent Results (from the past 2160 hour(s))  POCT INR     Status: None   Collection Time    08/14/13  9:47 AM      Result Value Ref Range   INR 2.3    POCT INR     Status: None   Collection Time    09/09/13 11:18 AM      Result Value Ref Range   INR 2.8    POCT INR     Status: None    Collection Time    10/07/13 11:43 AM      Result Value Ref Range   INR 2.6    POCT INR     Status: None   Collection Time    11/04/13 11:50 AM      Result Value Ref Range   INR 2.1    COMPREHENSIVE METABOLIC PANEL     Status: Abnormal   Collection Time    11/09/13  3:14 PM      Result Value Ref Range   Sodium 138  135 - 145 mEq/L   Potassium 4.1  3.5 - 5.1 mEq/L   Chloride 105  96 - 112 mEq/L   CO2 25  19 - 32 mEq/L   Glucose, Bld 100 (*) 70 - 99 mg/dL   BUN 15  6 - 23 mg/dL   Creatinine, Ser 0.9  0.4 - 1.5 mg/dL   Total Bilirubin 0.8  0.3 - 1.2 mg/dL   Alkaline Phosphatase 38 (*) 39 - 117 U/L   AST 29  0 - 37 U/L   ALT 14  0 - 53 U/L   Total Protein 7.5  6.0 - 8.3 g/dL   Albumin 4.6  3.5 - 5.2 g/dL   Calcium 9.1  8.4 - 10.5 mg/dL   GFR 89.22  >60.00 mL/min  LIPID PANEL     Status: None   Collection Time    11/09/13  3:14 PM      Result Value Ref Range   Cholesterol 160  0 - 200 mg/dL   Comment: ATP III Classification       Desirable:  < 200 mg/dL               Borderline High:  200 - 239 mg/dL          High:  > = 240 mg/dL   Triglycerides  57.0  0.0 - 149.0 mg/dL   Comment: Normal:  <150 mg/dLBorderline High:  150 - 199 mg/dL   HDL 56.60  >39.00 mg/dL   VLDL 11.4  0.0 - 40.0 mg/dL   LDL Cholesterol 92  0 - 99 mg/dL   Total CHOL/HDL Ratio 3     Comment:                Men          Women1/2 Average Risk     3.4          3.3Average Risk          5.0          4.42X Average Risk          9.6          7.13X Average Risk          15.0          11.0                      CBC WITH DIFFERENTIAL     Status: None   Collection Time    11/09/13  3:14 PM      Result Value Ref Range   WBC 4.8  4.5 - 10.5 K/uL   RBC 5.27  4.22 - 5.81 Mil/uL   Hemoglobin 15.9  13.0 - 17.0 g/dL   HCT 48.3  39.0 - 52.0 %   MCV 91.6  78.0 - 100.0 fl   MCHC 32.8  30.0 - 36.0 g/dL   RDW 13.8  11.5 - 14.6 %   Platelets 195.0  150.0 - 400.0 K/uL   Neutrophils Relative % 63.4  43.0 - 77.0 %    Lymphocytes Relative 24.3  12.0 - 46.0 %   Monocytes Relative 10.2  3.0 - 12.0 %   Eosinophils Relative 1.6  0.0 - 5.0 %   Basophils Relative 0.5  0.0 - 3.0 %   Neutro Abs 3.0  1.4 - 7.7 K/uL   Lymphs Abs 1.2  0.7 - 4.0 K/uL   Monocytes Absolute 0.5  0.1 - 1.0 K/uL   Eosinophils Absolute 0.1  0.0 - 0.7 K/uL   Basophils Absolute 0.0  0.0 - 0.1 K/uL  HEMOGLOBIN A1C     Status: None   Collection Time    11/09/13  3:14 PM      Result Value Ref Range   Hemoglobin A1C 5.8  4.6 - 6.5 %   Comment: Glycemic Control Guidelines for People with Diabetes:Non Diabetic:  <6%Goal of Therapy: <7%Additional Action Suggested:  >8%          Assessment & Plan:

## 2013-11-09 NOTE — Progress Notes (Signed)
Pre visit review using our clinic review tool, if applicable. No additional management support is needed unless otherwise documented below in the visit note. 

## 2013-11-10 DIAGNOSIS — Z0000 Encounter for general adult medical examination without abnormal findings: Secondary | ICD-10-CM | POA: Insufficient documentation

## 2013-11-10 DIAGNOSIS — Z7189 Other specified counseling: Secondary | ICD-10-CM | POA: Insufficient documentation

## 2013-11-10 NOTE — Assessment & Plan Note (Signed)
No active complaint at today's visit.

## 2013-11-10 NOTE — Assessment & Plan Note (Signed)
Holding sinus rhythm. He has no report of rapid rate, syncope or near syncope.  Plan F/u with Dr. Caryl Comes as directed

## 2013-11-10 NOTE — Assessment & Plan Note (Signed)
Taking and tolerating low dose "statin" therapy. He does follow a low fat diet.  Plan Follow up lab  Addendum - LDL at less than 100, HDL greater than 50  Plan Continue present medication

## 2013-11-10 NOTE — Assessment & Plan Note (Signed)
Very minimal progression of osteopenia.  Plan Calcium 1200 mg (diet + supplement) daily  Vit D 1,000 iu daily  Repeat DEXA Nov '16

## 2013-11-10 NOTE — Assessment & Plan Note (Signed)
Stable with no complaints  Plan Continued risk factor control.  F/u with cardiology as instructed.

## 2013-11-10 NOTE — Assessment & Plan Note (Signed)
He voices no complaints and he does remain very active doing cardio/treadmill exercise without limitation.

## 2013-11-10 NOTE — Assessment & Plan Note (Signed)
BP Readings from Last 3 Encounters:  11/09/13 140/80  05/22/13 133/81  12/02/12 120/70   He reports occasional excursions of SBP but rarely has had to use clonidine for rescue.  Plan Monitor BP at home  For preponderance of SBP readings above 140 will consider additional medication, favoring a diuretic for isolate elevations of SBP.

## 2013-11-10 NOTE — Assessment & Plan Note (Signed)
No testicular findings on exam. He is current with Dr. Gaynelle Arabian.  Plan For any testicular soreness he should be sure he has good support: jockey briefs or jock strap.

## 2013-11-10 NOTE — Assessment & Plan Note (Signed)
Interval history without major illness/hospitalization; surgery; injury. Physical exam, sans prostate, normal. Labs reviewed - in normal range. He is current with colorectal and prostate cancer screening. Immunizations are up to date.  In summary A very nice man with a complex medical history who is medically stable at today's exam. He is reminded that he is due for routine colonoscopy fall '15; due for Pneumovax booster in Oct '16. For continuing primary care he will consider options of colleagues in this office or outside groups including [facility] or [facility].

## 2013-11-10 NOTE — Assessment & Plan Note (Signed)
States he would not want futile care or prolonged advanced heroic measures in the face of irreversible illness. OK for CPR and short term mechanical ventilation (Feb '15)

## 2013-11-30 ENCOUNTER — Other Ambulatory Visit: Payer: Self-pay | Admitting: Internal Medicine

## 2013-12-01 ENCOUNTER — Encounter: Payer: Self-pay | Admitting: Internal Medicine

## 2013-12-01 ENCOUNTER — Ambulatory Visit (INDEPENDENT_AMBULATORY_CARE_PROVIDER_SITE_OTHER): Payer: BC Managed Care – PPO | Admitting: Internal Medicine

## 2013-12-01 ENCOUNTER — Ambulatory Visit (INDEPENDENT_AMBULATORY_CARE_PROVIDER_SITE_OTHER): Payer: BC Managed Care – PPO | Admitting: Pharmacist Clinician (PhC)/ Clinical Pharmacy Specialist

## 2013-12-01 VITALS — BP 126/71 | HR 94 | Ht 78.0 in | Wt 245.1 lb

## 2013-12-01 DIAGNOSIS — I4891 Unspecified atrial fibrillation: Secondary | ICD-10-CM

## 2013-12-01 DIAGNOSIS — Z5181 Encounter for therapeutic drug level monitoring: Secondary | ICD-10-CM

## 2013-12-01 LAB — POCT INR: INR: 3.9

## 2013-12-01 MED ORDER — APIXABAN 5 MG PO TABS: 5.0000 mg | ORAL_TABLET | Freq: Two times a day (BID) | ORAL | Status: DC

## 2013-12-01 NOTE — Progress Notes (Signed)
Patient Care Team: Neena Rhymes, MD as PCP - General Ailene Rud, MD (Urology) Deboraha Sprang, MD (Cardiology) Smith Mince, MD as Consulting Physician (Dermatology) Yvette Rack., MD (Orthopedic Surgery) Stefanie Libel, MD (Family Medicine) Corliss Parish, MD (Ophthalmology) Linton Rump, MD (Ophthalmology)   HPI  [name] [name] is a 68 y.o. male Seen in followup for atrial fibrillation for which he status post catheter ablation at [facility]. He also has coronary disease hypertension and has had presumptive problems with dysautonomia  He has been treated with warfarin as opposed to the NOACs  He has done well.The patient denies chest pain, shortness of breath, nocturnal dyspnea, orthopnea or peripheral edema. There have been no palpitations, lightheadedness or syncope.  Possible TIA transient speech deficit 1990s       Past Medical History  Diagnosis Date  . DJD (degenerative joint disease)   . Leukopenia   . HTN (hypertension)   . CAD (coronary artery disease)   . COPD (chronic obstructive pulmonary disease)   . Osteoporosis   . Atrial fibrillation   . Cataract, nonsenile   . Finger, superficial foreign body (splinter), without major open wound, infected     Past Surgical History  Procedure Laterality Date  . Tonsillectomy    . Rf ablation for a. fib '09 - dr. Rolland Porter, [place]. x 2    . Cataract extraction      Current Outpatient Prescriptions  Medication Sig Dispense Refill  . alfuzosin (UROXATRAL) 10 MG 24 hr tablet Take 10 mg by mouth daily.        Marland Kitchen atorvastatin (LIPITOR) 20 MG tablet Take 0.5 tablets (10 mg total) by mouth daily.  30 tablet  10  . atorvastatin (LIPITOR) 20 MG tablet take 1 tablet by mouth once daily  30 tablet  10  . calcium-vitamin D (OSCAL WITH D) 500-200 MG-UNIT per tablet 2 tabs po am and 1 tab po pm       . cloNIDine (CATAPRES) 0.1 MG tablet TAKE 1/2 TAB EVERY 2 HOURS FOR SBP 160 TO 180, AND 1 TAB EVERY 2 HOURS FOR SBP 180  60  tablet  1  . co-enzyme Q-10 30 MG capsule Take 30 mg by mouth daily.      . finasteride (PROSCAR) 5 MG tablet Take 5 mg by mouth daily.        . Flaxseed, Linseed, 1000 MG CAPS 1 tab po qd       . Multiple Vitamin (MULTIVITAMIN) capsule Take 1 capsule by mouth daily.        . Omega-3 Fatty Acids (FISH OIL) 1200 MG CAPS 1 tab po qd       . ramipril (ALTACE) 10 MG capsule take 2 capsules by mouth once daily  60 capsule  PRN  . vitamin B-12 (CYANOCOBALAMIN) 100 MCG tablet Take 50 mcg by mouth daily.        . Vitamins C E (CRANBERRY CONCENTRATE PO) Take 1 tablet by mouth.      . warfarin (COUMADIN) 5 MG tablet Take as directed by coumadin clinic  65 tablet  3   No current facility-administered medications for this visit.    No Known Allergies  Review of Systems negative except from HPI and PMH  Physical Exam BP 126/71  Pulse 94  Ht 6\' 6"  (1.981 m)  Wt 245 lb 1.9 oz (111.186 kg)  BMI 28.33 kg/m2 Well developed and nourished in no acute distress HENT normal Neck supple with  JVP-flat Clear Regular rate and rhythm, no murmurs or gallops Abd-soft with active BS No Clubbing cyanosis edema Skin-warm and dry A & Oriented  Grossly normal sensory and motor function\    Assessment and  Plan  Atrial fibrillaton  TIA  CAD  OI  Resolved  Have decided to change to NOAC   Have reviewed data Otherwise stable

## 2013-12-01 NOTE — Patient Instructions (Addendum)
Your physician has recommended you make the following change in your medication:  1) Stop Coumadin  2) Start Eliquis 5 mg twice daily --- start this on Friday   Your physician wants you to follow-up in: 6 months with Dr. Caryl Comes.  You will receive a reminder letter in the mail two months in advance. If you don't receive a letter, please call our office to schedule the follow-up appointment.

## 2013-12-22 ENCOUNTER — Ambulatory Visit (INDEPENDENT_AMBULATORY_CARE_PROVIDER_SITE_OTHER): Payer: BC Managed Care – PPO | Admitting: General Practice

## 2013-12-22 ENCOUNTER — Other Ambulatory Visit: Payer: Self-pay | Admitting: *Deleted

## 2013-12-22 ENCOUNTER — Other Ambulatory Visit: Payer: Self-pay

## 2013-12-22 DIAGNOSIS — I4891 Unspecified atrial fibrillation: Secondary | ICD-10-CM

## 2013-12-22 DIAGNOSIS — Z5181 Encounter for therapeutic drug level monitoring: Secondary | ICD-10-CM

## 2013-12-22 MED ORDER — APIXABAN 5 MG PO TABS: 5.0000 mg | ORAL_TABLET | Freq: Two times a day (BID) | ORAL | Status: DC

## 2013-12-22 NOTE — Progress Notes (Signed)
Pre visit review using our clinic review tool, if applicable. No additional management support is needed unless otherwise documented below in the visit note. 

## 2013-12-23 ENCOUNTER — Other Ambulatory Visit: Payer: Self-pay | Admitting: *Deleted

## 2014-01-19 ENCOUNTER — Telehealth: Payer: Self-pay | Admitting: Internal Medicine

## 2014-01-19 MED ORDER — RAMIPRIL 10 MG PO CAPS: 20.0000 mg | ORAL_CAPSULE | Freq: Every day | ORAL | Status: DC

## 2014-01-19 NOTE — Telephone Encounter (Signed)
refill 

## 2014-01-19 NOTE — Telephone Encounter (Signed)
,    I have not gotten a new primary doctor yet and one of my prescriptions from  Dr. Linda Hedges is about to run out and I was going to see if you could perhaps now prescribe it for me.  It is ramipril  10 mg two capsules by mouth once daily.  It is the generic for altace. It is my blood pressure medicine.  I get my medicine at the [facility] [place] store [phone].  I have enough for about two weeks.  Thanks,  [name] [name].

## 2014-01-21 ENCOUNTER — Other Ambulatory Visit: Payer: Self-pay | Admitting: *Deleted

## 2014-01-21 MED ORDER — RAMIPRIL 10 MG PO CAPS: 20.0000 mg | ORAL_CAPSULE | Freq: Every day | ORAL | Status: DC

## 2014-04-07 ENCOUNTER — Encounter: Payer: Self-pay | Admitting: Gastroenterology

## 2014-06-01 ENCOUNTER — Encounter: Payer: Self-pay | Admitting: Gastroenterology

## 2014-06-03 ENCOUNTER — Encounter: Payer: Self-pay | Admitting: Internal Medicine

## 2014-06-03 ENCOUNTER — Ambulatory Visit (INDEPENDENT_AMBULATORY_CARE_PROVIDER_SITE_OTHER): Payer: BC Managed Care – PPO | Admitting: Internal Medicine

## 2014-06-03 VITALS — BP 102/74 | HR 79 | Ht 78.0 in | Wt 233.4 lb

## 2014-06-03 DIAGNOSIS — I4891 Unspecified atrial fibrillation: Secondary | ICD-10-CM

## 2014-06-03 NOTE — Patient Instructions (Signed)
Your physician recommends that you continue on your current medications as directed. Please refer to the Current Medication list given to you today.  Your physician wants you to follow-up in: 1 year with Dr. Klein.  You will receive a reminder letter in the mail two months in advance. If you don't receive a letter, please call our office to schedule the follow-up appointment.  

## 2014-06-03 NOTE — Progress Notes (Signed)
Patient Care Team: Neena Rhymes, MD as PCP - General Ailene Rud, MD (Urology) Deboraha Sprang, MD (Cardiology) Jarome Matin, MD as Consulting Physician (Dermatology) Yvette Rack., MD (Orthopedic Surgery) Stefanie Libel, MD (Family Medicine) Corliss Parish, MD (Ophthalmology) Linton Rump, MD (Ophthalmology)   HPI  [name] [name] is a 68 y.o. male Seen in followup for atrial fibrillation for which he status post catheter ablation at [facility]. He also has coronary disease hypertension and has had presumptive problems with dysautonomia   He had been treated with warfarin as opposed to the NOACs that his last visit we decided to pursue  therapy with apixaban  He has done well.The patient denies chest pain, shortness of breath, nocturnal dyspnea, orthopnea or peripheral edema. There have been no palpitations, lightheadedness or syncope.  Possible TIA transient speech deficit 1990s  He has had vague intermittent chest pains on the right side typically lasting seconds. They can come and go over a day.  Blood pressure has been relatively well controlled. He does have some orthostatic intolerance. He continues to exercise regularly about an hour a day.     Past Medical History  Diagnosis Date  . DJD (degenerative joint disease)   . Leukopenia   . HTN (hypertension)   . CAD (coronary artery disease)   . COPD (chronic obstructive pulmonary disease)   . Osteoporosis   . Atrial fibrillation   . Cataract, nonsenile   . Finger, superficial foreign body (splinter), without major open wound, infected   . TIA (transient ischemic attack) 1990s    transient speech deficit    Past Surgical History  Procedure Laterality Date  . Tonsillectomy    . Rf ablation for a. fib '09 - dr. Rolland Porter, [place]. x 2    . Cataract extraction      Current Outpatient Prescriptions  Medication Sig Dispense Refill  . alfuzosin (UROXATRAL) 10 MG 24 hr tablet Take 10 mg by mouth daily.          Marland Kitchen apixaban (ELIQUIS) 5 MG TABS tablet Take 1 tablet (5 mg total) by mouth 2 (two) times daily.  60 tablet  10  . atorvastatin (LIPITOR) 20 MG tablet Take 0.5 tablets (10 mg total) by mouth daily.  30 tablet  10  . calcium-vitamin D (OSCAL WITH D) 500-200 MG-UNIT per tablet 2 tabs po am and 1 tab po pm       . cloNIDine (CATAPRES) 0.1 MG tablet TAKE 1/2 TAB EVERY 2 HOURS FOR SBP 160 TO 180, AND 1 TAB EVERY 2 HOURS FOR SBP 180  60 tablet  1  . co-enzyme Q-10 30 MG capsule Take 30 mg by mouth daily.      . finasteride (PROSCAR) 5 MG tablet Take 5 mg by mouth daily.        . Flaxseed, Linseed, 1000 MG CAPS 1 tab po qd       . Multiple Vitamin (MULTIVITAMIN) capsule Take 1 capsule by mouth daily.        . Omega-3 Fatty Acids (FISH OIL) 1200 MG CAPS 1 tab po qd       . ramipril (ALTACE) 10 MG capsule Take 2 capsules (20 mg total) by mouth daily.  60 capsule  5  . vitamin B-12 (CYANOCOBALAMIN) 100 MCG tablet Take 50 mcg by mouth daily.         No current facility-administered medications for this visit.    No Known Allergies  Review of Systems negative except from HPI and PMH  Physical Exam BP 102/74  Pulse 79  Ht 6\' 6"  (1.981 m)  Wt 233 lb 6.4 oz (105.87 kg)  BMI 26.98 kg/m2 Well developed and well nourished in no acute distress HENT normal E scleral and icterus clear Neck Supple JVP flat; carotids brisk and full Clear to ausculation { Regular rate and rhythm, no murmurs gallops or rub Soft with active bowel sounds No clubbing cyanosis  Edema Alert and oriented, grossly normal motor and sensory function Skin Warm and Dry  ECG demonstrates sinus rhythm at 79 intervals 20/08/36   otherwise normal  Assessment and  Plan  Atrial fibrillation-status post ablation musc   Coronary artery disease with prior stenting  Labile hypertension-dysautonomic  Obstructive sleep apnea on therapy  Without symptoms of ischemia  No intercurrent Atrial fibrillation or flutter  Blood  pressures are reasonably controlled.  He will need preauthorization for sleep mask.

## 2014-06-10 NOTE — Addendum Note (Signed)
Addended by: Lubertha Basque A on: 06/10/2014 12:57 PM   Modules accepted: Orders

## 2014-06-22 ENCOUNTER — Ambulatory Visit: Payer: BC Managed Care – PPO | Admitting: Gastroenterology

## 2014-06-22 ENCOUNTER — Telehealth: Payer: Self-pay | Admitting: Internal Medicine

## 2014-06-22 ENCOUNTER — Encounter: Payer: Self-pay | Admitting: Gastroenterology

## 2014-06-22 ENCOUNTER — Ambulatory Visit (INDEPENDENT_AMBULATORY_CARE_PROVIDER_SITE_OTHER): Payer: BC Managed Care – PPO | Admitting: Gastroenterology

## 2014-06-22 VITALS — BP 132/62 | HR 88 | Ht 78.0 in | Wt 234.4 lb

## 2014-06-22 DIAGNOSIS — R194 Change in bowel habit: Secondary | ICD-10-CM

## 2014-06-22 DIAGNOSIS — R198 Other specified symptoms and signs involving the digestive system and abdomen: Secondary | ICD-10-CM

## 2014-06-22 DIAGNOSIS — R1032 Left lower quadrant pain: Secondary | ICD-10-CM

## 2014-06-22 MED ORDER — MOVIPREP 100 G PO SOLR: 1.0000 | Freq: Once | ORAL | Status: DC

## 2014-06-22 NOTE — Telephone Encounter (Signed)
New message          Patty RN would like to know if pt can stop eliquuis before her procedure 10/6

## 2014-06-22 NOTE — Patient Instructions (Signed)

## 2014-06-22 NOTE — Progress Notes (Signed)
Review of pertinent gastrointestinal problems: 1. Family history of colon cancer.  Colonoscopy Dr. Verl Blalock 08/2009 found no polyps or cancers, was recommended to have recall examination in 5 years.  Reviewing FH 05/2014 one uncle had colon cancer and "maybe an aunt."  This is not a significant FH and would not require screening on 5 year interval   HPI: This is a  very pleasant 68 year old man whom I am meeting for the first time today. He has noticed a change in his bowel habits over the past few months. Specifically difficulty having bowel movements, smaller caliber bowels. He has noticed no overt bleeding and has not lost any weight.  He has also had some intermittent left sided lower abdominal discomfort associated with his bowel movements. He feels that something is blocking. Otherwise he feels well. He is on a blood thinner for atrial fibrillation, history of TIA.   Review of systems: Pertinent positive and negative review of systems were noted in the above HPI section. Complete review of systems was performed and was otherwise normal.    Past Medical History  Diagnosis Date  . DJD (degenerative joint disease)   . Leukopenia   . HTN (hypertension)   . CAD (coronary artery disease)   . COPD (chronic obstructive pulmonary disease)   . Osteoporosis   . Atrial fibrillation   . Cataract, nonsenile   . Finger, superficial foreign body (splinter), without major open wound, infected   . TIA (transient ischemic attack) 1990s    transient speech deficit  . Cardiac arrhythmia due to congenital heart disease   . GERD (gastroesophageal reflux disease)   . Sleep apnea     Past Surgical History  Procedure Laterality Date  . Tonsillectomy    . Rf ablation for a. fib '09 - dr. Rolland Porter, [place]. x 2    . Cataract extraction    . Appendectomy  1951    Current Outpatient Prescriptions  Medication Sig Dispense Refill  . alfuzosin (UROXATRAL) 10 MG 24 hr tablet Take 10 mg by mouth daily.         Marland Kitchen apixaban (ELIQUIS) 5 MG TABS tablet Take 1 tablet (5 mg total) by mouth 2 (two) times daily.  60 tablet  10  . atorvastatin (LIPITOR) 20 MG tablet Take 0.5 tablets (10 mg total) by mouth daily.  30 tablet  10  . calcium-vitamin D (OSCAL WITH D) 500-200 MG-UNIT per tablet 2 tabs po am and 1 tab po pm       . cloNIDine (CATAPRES) 0.1 MG tablet TAKE 1/2 TAB EVERY 2 HOURS FOR SBP 160 TO 180, AND 1 TAB EVERY 2 HOURS FOR SBP 180; as needed      . co-enzyme Q-10 30 MG capsule Take 100 mg by mouth 4 (four) times daily as needed.       Marland Kitchen FIBER PO Take 5-6 capsules by mouth daily.      . finasteride (PROSCAR) 5 MG tablet Take 5 mg by mouth daily.        . Flaxseed, Linseed, 1000 MG CAPS 1,400 mg daily. 1 tab po qd      . Multiple Vitamin (MULTIVITAMIN) capsule Take 1 capsule by mouth daily.        . Omega-3 Fatty Acids (FISH OIL) 1200 MG CAPS 1,000 mg daily. 1 tab po qd      . Probiotic Product (PROBIOTIC DAILY PO) Take 10 mg by mouth daily.      . ramipril (ALTACE) 10 MG capsule  Take 2 capsules (20 mg total) by mouth daily.  60 capsule  5  . vitamin B-12 (CYANOCOBALAMIN) 100 MCG tablet Take 1,250 mcg by mouth daily.       Marland Kitchen MOVIPREP 100 G SOLR Take 1 kit (200 g total) by mouth once.  1 kit  0   No current facility-administered medications for this visit.    Allergies as of 06/22/2014  . (No Known Allergies)    Family History  Problem Relation Age of Onset  . Heart attack Father   . Coronary artery disease Father   . Transient ischemic attack Mother   . Coronary artery disease Mother     h/o TIA   . Stroke Mother   . Colon cancer Other     aunt and uncle    History   Social History  . Marital Status: Married    Spouse Name: N/A    Number of Children: 2  . Years of Education: 39   Occupational History  . ATTORNEY    Social History Main Topics  . Smoking status: Never Smoker   . Smokeless tobacco: Never Used  . Alcohol Use: Yes  . Drug Use: No  . Sexual Activity: Yes     Partners: Female   Other Topics Concern  . Not on file   Social History Narrative   [employer]; [employer]. Married '79. 2 daughters - '80, '82. His daughter's rowing team won the world championship in [place] '11. work: [employer]. Marriage in good health. Advanced Care Planning - discussed. He is aware of these issues. States he would not want futile care or prolonged advanced heroic measures in the face of irreversible illness.                    Physical Exam: BP 132/62  Pulse 88  Ht $R'6\' 6"'eq$  (1.981 m)  Wt 234 lb 6 oz (106.312 kg)  BMI 27.09 kg/m2 Constitutional: generally well-appearing Psychiatric: alert and oriented x3 Eyes: extraocular movements intact Mouth: oral pharynx moist, no lesions Neck: supple no lymphadenopathy Cardiovascular: heart regular rate and rhythm Lungs: clear to auscultation bilaterally Abdomen: soft, nontender, nondistended, no obvious ascites, no peritoneal signs, normal bowel sounds Extremities: no lower extremity edema bilaterally Skin: no lesions on visible extremities    Assessment and plan: 68 y.o. male with  change in his bowel habits over the past few months,  Intermittent left-sided abdominal pains  I would proceed with colonoscopy at his soonest convenience given his change in bowel habits, intermittent left-sided abdominal pains. He is on a blood thinner that would need to be held one to 2 days prior to a colonoscopy and will clear this with his cardiologist. He is concerned about potential insurance issues and wants to make sure that his insurance company will "cover the procedure" . we will have our insurance intermediary here in the office pineapples details for him.,

## 2014-06-24 ENCOUNTER — Telehealth: Payer: Self-pay

## 2014-06-24 NOTE — Telephone Encounter (Signed)
See alternate note regarding Eliquis hold

## 2014-06-24 NOTE — Telephone Encounter (Signed)
Notified Patty @ [facility]: OK to stop Eliquis 24 hours prior to procedure, per Dr. Caryl Comes.

## 2014-06-24 NOTE — Telephone Encounter (Signed)
Message copied by Barron Alvine on Thu Jun 24, 2014  8:11 AM ------      Message from: Mason Jim, AMY L      Created: Wed Jun 23, 2014  3:58 PM       Please call this nice man when you come in tomorrow morning.  He wants to give you some information about his blood thinner.  He can be reached on his cell phone number      Thanks Amy ------

## 2014-06-24 NOTE — Telephone Encounter (Signed)
Pt aware.

## 2014-06-29 ENCOUNTER — Ambulatory Visit (AMBULATORY_SURGERY_CENTER): Payer: BC Managed Care – PPO | Admitting: Gastroenterology

## 2014-06-29 ENCOUNTER — Encounter: Payer: Self-pay | Admitting: Gastroenterology

## 2014-06-29 VITALS — BP 103/61 | HR 80 | Temp 97.1°F | Resp 18 | Ht 78.0 in | Wt 234.0 lb

## 2014-06-29 DIAGNOSIS — R194 Change in bowel habit: Secondary | ICD-10-CM

## 2014-06-29 DIAGNOSIS — R1032 Left lower quadrant pain: Secondary | ICD-10-CM

## 2014-06-29 MED ORDER — SODIUM CHLORIDE 0.9 % IV SOLN: 500.0000 mL | INTRAVENOUS | Status: DC

## 2014-06-29 NOTE — Op Note (Signed)
[facility]  [address] [place], [address]   COLONOSCOPY PROCEDURE REPORT  PATIENT: [name], [name]  MR#: [id number] BIRTHDATE: Oct 28, 1945 , 68  yrs. old GENDER: male ENDOSCOPIST: Milus Banister, MD PROCEDURE DATE:  06/29/2014 PROCEDURE:   Colonoscopy, diagnostic First Screening Colonoscopy - Avg.  risk and is 50 yrs.  old or older - No.  Prior Negative Screening - Now for repeat screening. N/A  History of Adenoma - Now for follow-up colonoscopy & has been > or = to 3 yrs.  N/A  Polyps Removed Today? No.  Recommend repeat exam, <10 yrs? No. ASA CLASS:   Class II INDICATIONS:change in bowel habits; colonoscopy Dr.  Sharlett Iles 2010 was normal. MEDICATIONS: Monitored anesthesia care and Propofol 200 mg IV  DESCRIPTION OF PROCEDURE:   After the risks benefits and alternatives of the procedure were thoroughly explained, informed consent was obtained.  The digital rectal exam revealed no abnormalities of the rectum.   The LB [id number]  endoscope was introduced through the anus and advanced to the cecum, which was identified by both the appendix and ileocecal valve. No adverse events experienced.   The quality of the prep was good.  The instrument was then slowly withdrawn as the colon was fully examined.   COLON FINDINGS: A normal appearing cecum, ileocecal valve, and appendiceal orifice were identified.  The ascending, transverse, descending, sigmoid colon, and rectum appeared unremarkable. Retroflexed views revealed no abnormalities. The time to cecum=3 minutes 26 seconds.  Withdrawal time=7 minutes 15 seconds.  The scope was withdrawn and the procedure completed. COMPLICATIONS: There were no immediate complications.  ENDOSCOPIC IMPRESSION: Normal colonoscopy No polyps or cancers  RECOMMENDATIONS: You should continue to follow colorectal cancer screening guidelines for "routine risk" patients with a repeat colonoscopy in 10 years.  eSigned:   Milus Banister, MD 06/29/2014 11:14 AM   cc: Rich Brave, MD

## 2014-06-29 NOTE — Patient Instructions (Addendum)
YOU HAD AN ENDOSCOPIC PROCEDURE TODAY AT THE [facility]: Refer to the procedure report that was given to you for any specific questions about what was found during the examination.  If the procedure report does not answer your questions, please call your gastroenterologist to clarify.  If you requested that your care partner not be given the details of your procedure findings, then the procedure report has been included in a sealed envelope for you to review at your convenience later.  YOU SHOULD EXPECT: Some feelings of bloating in the abdomen. Passage of more gas than usual.  Walking can help get rid of the air that was put into your GI tract during the procedure and reduce the bloating. If you had a lower endoscopy (such as a colonoscopy or flexible sigmoidoscopy) you may notice spotting of blood in your stool or on the toilet paper. If you underwent a bowel prep for your procedure, then you may not have a normal bowel movement for a few days.  DIET: Your first meal following the procedure should be a light meal and then it is ok to progress to your normal diet.  A half-sandwich or bowl of soup is an example of a good first meal.  Heavy or fried foods are harder to digest and may make you feel nauseous or bloated.  Likewise meals heavy in dairy and vegetables can cause extra gas to form and this can also increase the bloating.  Drink plenty of fluids but you should avoid alcoholic beverages for 24 hours.  ACTIVITY: Your care partner should take you home directly after the procedure.  You should plan to take it easy, moving slowly for the rest of the day.  You can resume normal activity the day after the procedure however you should NOT DRIVE or use heavy machinery for 24 hours (because of the sedation medicines used during the test).    SYMPTOMS TO REPORT IMMEDIATELY: A gastroenterologist can be reached at any hour.  During normal business hours, 8:30 AM to 5:00 PM Monday through Friday,  call [phone].  After hours and on weekends, please call the GI answering service at [phone] who will take a message and have the physician on call contact you.   Following lower endoscopy (colonoscopy or flexible sigmoidoscopy):  Excessive amounts of blood in the stool  Significant tenderness or worsening of abdominal pains  Swelling of the abdomen that is new, acute  Fever of 100F or higher    FOLLOW UP: If any biopsies were taken you will be contacted by phone or by letter within the next 1-3 weeks.  Call your gastroenterologist if you have not heard about the biopsies in 3 weeks.  Our staff will call the home number listed on your records the next business day following your procedure to check on you and address any questions or concerns that you may have at that time regarding the information given to you following your procedure. This is a courtesy call and so if there is no answer at the home number and we have not heard from you through the emergency physician on call, we will assume that you have returned to your regular daily activities without incident.  SIGNATURES/CONFIDENTIALITY: You and/or your care partner have signed paperwork which will be entered into your electronic medical record.  These signatures attest to the fact that that the information above on your After Visit Summary has been reviewed and is understood.  Full responsibility of the confidentiality   of this discharge information lies with you and/or your care-partner.  Normal colonoscopy.  Repeat in 10 years-2025.  Resume Eliquis today.

## 2014-06-29 NOTE — Progress Notes (Signed)
A/ox3 pleased with MAC, report to Jane RN 

## 2014-06-30 ENCOUNTER — Telehealth: Payer: Self-pay | Admitting: *Deleted

## 2014-06-30 NOTE — Telephone Encounter (Signed)
No answer, left message to call if questions or concerns. 

## 2014-09-22 ENCOUNTER — Other Ambulatory Visit: Payer: Self-pay | Admitting: *Deleted

## 2014-09-30 ENCOUNTER — Other Ambulatory Visit: Payer: Self-pay | Admitting: *Deleted

## 2014-09-30 MED ORDER — RAMIPRIL 10 MG PO CAPS: 20.0000 mg | ORAL_CAPSULE | Freq: Every day | ORAL | Status: DC

## 2014-11-15 ENCOUNTER — Other Ambulatory Visit: Payer: Self-pay | Admitting: *Deleted

## 2014-11-15 MED ORDER — APIXABAN 5 MG PO TABS: 5.0000 mg | ORAL_TABLET | Freq: Two times a day (BID) | ORAL | Status: DC

## 2015-03-25 ENCOUNTER — Other Ambulatory Visit: Payer: Self-pay | Admitting: *Deleted

## 2015-03-25 MED ORDER — RAMIPRIL 10 MG PO CAPS: 20.0000 mg | ORAL_CAPSULE | Freq: Every day | ORAL | Status: DC

## 2015-05-31 ENCOUNTER — Other Ambulatory Visit: Payer: Self-pay

## 2015-05-31 MED ORDER — RAMIPRIL 10 MG PO CAPS: 20.0000 mg | ORAL_CAPSULE | Freq: Every day | ORAL | Status: DC

## 2015-05-31 NOTE — Telephone Encounter (Signed)
Deboraha Sprang, MD at 06/03/2014 12:14 PM  ramipril (ALTACE) 10 MG capsule Take 2 capsules (20 mg total) by mouth daily Patient Instructions     Your physician recommends that you continue on your current medications as directed. Please refer to the Current Medication list given to you today.

## 2015-09-28 ENCOUNTER — Other Ambulatory Visit: Payer: Self-pay

## 2015-09-28 MED ORDER — APIXABAN 5 MG PO TABS: 5.0000 mg | ORAL_TABLET | Freq: Two times a day (BID) | ORAL | Status: DC

## 2015-09-29 ENCOUNTER — Other Ambulatory Visit: Payer: Self-pay | Admitting: Internal Medicine

## 2015-10-10 ENCOUNTER — Ambulatory Visit (INDEPENDENT_AMBULATORY_CARE_PROVIDER_SITE_OTHER): Payer: BC Managed Care – PPO | Admitting: Internal Medicine

## 2015-10-10 ENCOUNTER — Encounter: Payer: Self-pay | Admitting: Internal Medicine

## 2015-10-10 VITALS — BP 116/76 | HR 76 | Ht 78.0 in | Wt 238.4 lb

## 2015-10-10 DIAGNOSIS — I4891 Unspecified atrial fibrillation: Secondary | ICD-10-CM | POA: Diagnosis not present

## 2015-10-10 MED ORDER — RAMIPRIL 10 MG PO CAPS: 20.0000 mg | ORAL_CAPSULE | Freq: Every day | ORAL | Status: DC

## 2015-10-10 NOTE — Patient Instructions (Signed)
Medication Instructions: - no changes  Labwork: - none  Procedures/Testing: - none  Follow-Up: - Your physician wants you to follow-up in: 1 year with Dr. Klein You will receive a reminder letter in the mail two months in advance. If you don't receive a letter, please call our office to schedule the follow-up appointment.  Any Additional Special Instructions Will Be Listed Below (If Applicable).   

## 2015-10-10 NOTE — Progress Notes (Signed)
Patient Care Team: Neena Rhymes, MD as PCP - General Carolan Clines, MD (Urology) Deboraha Sprang, MD (Cardiology) Jarome Matin, MD as Consulting Physician (Dermatology) Earlie Server, MD (Orthopedic Surgery) Stefanie Libel, MD (Family Medicine) Sherlynn Stalls, MD (Ophthalmology) Calvert Cantor, MD (Ophthalmology)   HPI  [name] [name] is a 70 y.o. male Seen in followup for atrial fibrillation for which he status post catheter ablation at [name]. He also has coronary disease hypertension and has had presumptive problems with dysautonomia   He had been treated with warfarin as opposed to the NOACs that his last visit we decided to pursue  therapy with apixaban  He has done well.The patient denies chest pain, shortness of breath, nocturnal dyspnea, orthopnea or peripheral edema. There have been no palpitations, lightheadedness or syncope.   He has uri and sore throat  Possible TIA transient speech deficit 1990s  Blood pressure has been relatively well controlled.    He reminds me that his younger brother MD died 2012/01/21    Past Medical History  Diagnosis Date  . DJD (degenerative joint disease)   . Leukopenia   . HTN (hypertension)   . CAD (coronary artery disease)   . COPD (chronic obstructive pulmonary disease) ([place])   . Osteoporosis   . Atrial fibrillation ([place])   . Cataract, nonsenile   . Finger, superficial foreign body (splinter), without major open wound, infected   . TIA (transient ischemic attack) 1990s    transient speech deficit  . Cardiac arrhythmia due to congenital heart disease   . GERD (gastroesophageal reflux disease)   . Sleep apnea   . Cancer [facility])     SKIN    Past Surgical History  Procedure Laterality Date  . Tonsillectomy    . Rf ablation for a. fib '09 - dr. Rolland Porter, [place]. x 2    . Cataract extraction      Current Outpatient Prescriptions  Medication Sig Dispense Refill  . alfuzosin (UROXATRAL) 10 MG 24 hr tablet Take 10 mg by mouth  daily.      Marland Kitchen amLODipine (NORVASC) 2.5 MG tablet Take 2.5 mg by mouth daily.    Marland Kitchen apixaban (ELIQUIS) 5 MG TABS tablet Take 1 tablet (5 mg total) by mouth 2 (two) times daily. 60 tablet 1  . atorvastatin (LIPITOR) 10 MG tablet Take 10 mg by mouth daily.    Marland Kitchen atorvastatin (LIPITOR) 20 MG tablet Take 0.5 tablets (10 mg total) by mouth daily. 30 tablet 10  . b-complex w/C (TOTAL B/C) TABS tablet Take 1 tablet by mouth daily.    . calcium-vitamin D (OSCAL WITH D) 500-200 MG-UNIT per tablet 2 tabs po am and 1 tab po pm     . cloNIDine (CATAPRES) 0.1 MG tablet TAKE 1/2 TAB EVERY 2 HOURS FOR SBP 160 TO 180, AND 1 TAB EVERY 2 HOURS FOR SBP 180; as needed    . co-enzyme Q-10 30 MG capsule Take 100 mg by mouth 4 (four) times daily as needed.     Marland Kitchen FIBER PO Take 5-6 capsules by mouth daily.    . finasteride (PROSCAR) 5 MG tablet Take 5 mg by mouth daily.      . Multiple Vitamins-Minerals (MULTI COMPLETE PO) Take 1 capsule by mouth daily.    . Omega-3 Fatty Acids (OMEGA 3 PO) Take 1 capsule by mouth daily.    . Probiotic Product (PROBIOTIC DAILY PO) Take 10 mg by mouth daily.    . ramipril (ALTACE) 10  MG capsule Take 2 capsules (20 mg total) by mouth daily. 180 capsule 3   No current facility-administered medications for this visit.    No Known Allergies  Review of Systems negative except from HPI and PMH  Physical Exam BP 116/76 mmHg  Pulse 76  Ht 6\' 6"  (1.981 m)  Wt 238 lb 6.4 oz (108.138 kg)  BMI 27.56 kg/m2 Well developed and well nourished in no acute distress Papules on the tonsillar pillars E scleral and icterus clear Neck Supple JVP flat; carotids brisk and full Clear to ausculation   Regular rate and rhythm, no murmurs gallops or rub Soft with active bowel sounds No clubbing cyanosis  Edema Alert and oriented, grossly normal motor and sensory function Skin Warm and Dry  ECG demonstrates sinus rhythm at 76 Intervals 22/09/39  Assessment and  Plan  Atrial fibrillation-status  post ablation musc   Coronary artery disease with prior stenting  Labile hypertension-dysautonomic  Viral URI  Obstructive sleep apnea on therapy   Without symptoms of ischemia  No intercurrent atrial fibrillation or flutter  BP reasonably controlled on increasingly complicated regime  No bleeding issues on apixoban

## 2015-12-20 ENCOUNTER — Other Ambulatory Visit: Payer: Self-pay | Admitting: Internal Medicine

## 2015-12-21 ENCOUNTER — Telehealth: Payer: Self-pay | Admitting: Internal Medicine

## 2015-12-21 NOTE — Telephone Encounter (Signed)
Pt requesting refill of Eliquis [facility] [phone]

## 2015-12-21 NOTE — Telephone Encounter (Signed)
ELIQUIS 5 MG TABS tablet 60 tablet 9 12/21/2015      Sig: take 1 tablet by mouth twice a day    E-Prescribing Status: Receipt confirmed by pharmacy (12/21/2015 2:23 PM EDT)     Pharmacy    [id number], [place]     Refill already sent in as above.

## 2016-03-01 ENCOUNTER — Telehealth: Payer: Self-pay | Admitting: Internal Medicine

## 2016-03-01 DIAGNOSIS — R0602 Shortness of breath: Secondary | ICD-10-CM

## 2016-03-01 NOTE — Telephone Encounter (Signed)
Per Dr. Caryl Comes, ok to order nuclear stress test.   Triage, can you please call the patient tomorrow and just find out if he feels ok to walk on the treadmill.  I think he probably can.  Thanks!

## 2016-03-01 NOTE — Telephone Encounter (Signed)
New Message   Pt c/o Shortness Of Breath: STAT if SOB developed within the last 24 hours or pt is noticeably SOB on the phone  1. Are you currently SOB (can you hear that pt is SOB on the phone)? Not now but when he exercises.  2. How long have you been experiencing SOB? 6-8 months nothing really bad but he believed he should have a nuc stress test.  3. Are you SOB when sitting or when up moving around? Moving around  4.  Are you currently experiencing any other symptoms? No really   Comments: Pt simply asks for a nuc stress test.

## 2016-03-01 NOTE — Telephone Encounter (Signed)
Will forward to Dr. Klein to review. 

## 2016-03-02 NOTE — Telephone Encounter (Signed)
Spoke with pt and he states that he is able to walk on the treadmill for the test. Pt was driving out of town on vacation when I called. He will be out of town until June 28th and states that he will call the office when he returns so that we can go over instructions with him and get him scheduled since he is unable to write down instructions right now. Advised pt that I will send this message to Dr. Olin Pia nurse to make her aware.

## 2016-03-05 NOTE — Telephone Encounter (Signed)
Order placed for nuclear stress test. Will await a call back from the patient.

## 2016-03-16 ENCOUNTER — Telehealth (HOSPITAL_COMMUNITY): Payer: Self-pay | Admitting: Radiology

## 2016-03-16 NOTE — Telephone Encounter (Signed)
Patient given detailed instructions per Myocardial Perfusion Study Information Sheet for the test on 03/22/2016 at 7:15. Patient notified to arrive 15 minutes early and that it is imperative to arrive on time for appointment to keep from having the test rescheduled.  If you need to cancel or reschedule your appointment, please call the office within 24 hours of your appointment. Failure to do so may result in a cancellation of your appointment, and a $50 no show fee. Patient verbalized understanding.EHK

## 2016-03-22 ENCOUNTER — Ambulatory Visit (HOSPITAL_COMMUNITY): Payer: BC Managed Care – PPO | Attending: Cardiology

## 2016-03-22 DIAGNOSIS — R9439 Abnormal result of other cardiovascular function study: Secondary | ICD-10-CM | POA: Diagnosis not present

## 2016-03-22 DIAGNOSIS — R0609 Other forms of dyspnea: Secondary | ICD-10-CM | POA: Diagnosis not present

## 2016-03-22 DIAGNOSIS — I10 Essential (primary) hypertension: Secondary | ICD-10-CM | POA: Insufficient documentation

## 2016-03-22 DIAGNOSIS — R002 Palpitations: Secondary | ICD-10-CM | POA: Diagnosis not present

## 2016-03-22 DIAGNOSIS — R0602 Shortness of breath: Secondary | ICD-10-CM

## 2016-03-22 LAB — MYOCARDIAL PERFUSION IMAGING
CHL CUP MPHR: 150 {beats}/min
CHL RATE OF PERCEIVED EXERTION: 18
CSEPEW: 10.1 METS
CSEPHR: 91 %
CSEPPHR: 137 {beats}/min
Exercise duration (min): 8 min
Exercise duration (sec): 0 s
LHR: 0.29
LVDIAVOL: 150 mL (ref 62–150)
LVSYSVOL: 51 mL
Rest HR: 70 {beats}/min
SDS: 3
SRS: 3
SSS: 6
TID: 1.04

## 2016-03-22 MED ORDER — TECHNETIUM TC 99M TETROFOSMIN IV KIT
10.6000 | PACK | Freq: Once | INTRAVENOUS | Status: AC | PRN
  Administered 2016-03-22: 11 via INTRAVENOUS
  Filled 2016-03-22: qty 11

## 2016-03-22 MED ORDER — TECHNETIUM TC 99M TETROFOSMIN IV KIT
32.7000 | PACK | Freq: Once | INTRAVENOUS | Status: AC | PRN
  Administered 2016-03-22: 32.7 via INTRAVENOUS
  Filled 2016-03-22: qty 33

## 2016-03-26 ENCOUNTER — Telehealth: Payer: Self-pay | Admitting: Internal Medicine

## 2016-03-26 NOTE — Telephone Encounter (Signed)
New message   Pt is calling he verbalized that he is returning call for rn to give him the results of his MYOCARDIAL PERFUSION

## 2016-03-26 NOTE — Telephone Encounter (Signed)
I spoke with the patient and he is aware of his results.

## 2016-10-03 ENCOUNTER — Other Ambulatory Visit: Payer: Self-pay | Admitting: Internal Medicine

## 2016-11-05 ENCOUNTER — Encounter: Payer: Self-pay | Admitting: Internal Medicine

## 2016-11-09 ENCOUNTER — Ambulatory Visit: Payer: BC Managed Care – PPO | Admitting: Internal Medicine

## 2016-11-13 ENCOUNTER — Encounter: Payer: Self-pay | Admitting: Internal Medicine

## 2016-11-13 ENCOUNTER — Ambulatory Visit (INDEPENDENT_AMBULATORY_CARE_PROVIDER_SITE_OTHER): Payer: BC Managed Care – PPO | Admitting: Internal Medicine

## 2016-11-13 VITALS — BP 100/60 | HR 80 | Ht 78.0 in | Wt 246.0 lb

## 2016-11-13 DIAGNOSIS — I480 Paroxysmal atrial fibrillation: Secondary | ICD-10-CM

## 2016-11-13 MED ORDER — RAMIPRIL 10 MG PO CAPS: ORAL_CAPSULE | ORAL | Status: DC

## 2016-11-13 NOTE — Progress Notes (Signed)
Patient Care Team: Neena Rhymes, MD as PCP - General Carolan Clines, MD (Urology) Deboraha Sprang, MD (Cardiology) Jarome Matin, MD as Consulting Physician (Dermatology) Earlie Server, MD (Orthopedic Surgery) Stefanie Libel, MD (Family Medicine) Sherlynn Stalls, MD (Ophthalmology) Calvert Cantor, MD (Ophthalmology)   HPI  [name] [name] is a 71 y.o. male Seen in followup for atrial fibrillation for which he status post catheter ablation at [facility]. He also has coronary disease hypertension and has had presumptive problems with dysautonomia   He had been treated with warfarin as opposed to the NOACs that his last visit we decided to pursue  therapy with apixaban  He has done well.The patient denies chest pain, shortness of breath, nocturnal dyspnea, orthopnea or peripheral edema. There have been no  lightheadedness or syncope.   He has had some palpitations of late that occurred tried to adjust his miralax  and his beta blocker. He is back on his previous regime.  Possible TIA transient speech deficit 1990s  Blood pressure has been relatively well controlled.    He reminds me that his younger brother MD died 2012/01/16    Past Medical History:  Diagnosis Date  . Atrial fibrillation ([place])   . CAD (coronary artery disease)   . Cancer ([place])    SKIN  . Cardiac arrhythmia due to congenital heart disease   . Cataract, nonsenile   . COPD (chronic obstructive pulmonary disease) ([place])   . DJD (degenerative joint disease)   . Finger, superficial foreign body (splinter), without major open wound, infected   . GERD (gastroesophageal reflux disease)   . HTN (hypertension)   . Leukopenia   . Osteoporosis   . Sleep apnea   . TIA (transient ischemic attack) 1990s   transient speech deficit    Past Surgical History:  Procedure Laterality Date  . CATARACT EXTRACTION    . RF ablation for A. Fib '09 - Dr. Rolland Porter, [place]. x 2    . TONSILLECTOMY      Current Outpatient Prescriptions    Medication Sig Dispense Refill  . alfuzosin (UROXATRAL) 10 MG 24 hr tablet Take 10 mg by mouth daily.      Marland Kitchen amLODipine (NORVASC) 2.5 MG tablet Take 2.5 mg by mouth daily.    Marland Kitchen atorvastatin (LIPITOR) 10 MG tablet Take 10 mg by mouth daily.    Marland Kitchen atorvastatin (LIPITOR) 20 MG tablet Take 0.5 tablets (10 mg total) by mouth daily. 30 tablet 10  . b-complex w/C (TOTAL B/C) TABS tablet Take 1 tablet by mouth daily.    . calcium-vitamin D (OSCAL WITH D) 500-200 MG-UNIT per tablet 2 tabs po am and 1 tab po pm     . cloNIDine (CATAPRES) 0.1 MG tablet TAKE 1/2 TAB EVERY 2 HOURS FOR SBP 160 TO 180, AND 1 TAB EVERY 2 HOURS FOR SBP 180; as needed    . co-enzyme Q-10 30 MG capsule Take 100 mg by mouth 4 (four) times daily as needed.     Marland Kitchen ELIQUIS 5 MG TABS tablet take 1 tablet by mouth twice a day 60 tablet 1  . FIBER PO Take 5-6 capsules by mouth daily.    . finasteride (PROSCAR) 5 MG tablet Take 5 mg by mouth daily.      . Multiple Vitamins-Minerals (MULTI COMPLETE PO) Take 1 capsule by mouth daily.    . Omega-3 Fatty Acids (OMEGA 3 PO) Take 1 capsule by mouth daily.    . Probiotic Product (PROBIOTIC DAILY  PO) Take 10 mg by mouth daily.    . ramipril (ALTACE) 10 MG capsule Take 2 capsules (20 mg total) by mouth daily. 180 capsule 3   No current facility-administered medications for this visit.     No Known Allergies  Review of Systems negative except from HPI and PMH  Physical Exam BP 100/60   Pulse 80   Ht 6\' 6"  (1.981 m)   Wt 246 lb (111.6 kg)   SpO2 96%   BMI 28.43 kg/m  Well developed and well nourished in no acute distress Papules on the tonsillar pillars E scleral and icterus clear Neck Supple JVP flat; carotids brisk and full Clear to ausculation   Regular rate and rhythm, no murmurs gallops or rub Soft with active bowel sounds No clubbing cyanosis  Edema Alert and oriented, grossly normal motor and sensory function Skin Warm and Dry  ECG demonstrates s Assessment and   Plan  Atrial fibrillation-status post ablation musc   Coronary artery disease with prior stenting  Labile hypertension-dysautonomic  Obstructive sleep apnea on therapy   Without symptoms of ischemia  No significant  intercurrent atrial fibrillation or flutter  BP reasonably controlled  although it has been elevated of late morning. We will change his REM referral from 20 every morning--10 twice a day   On Anticoagulation;  No bleeding issues

## 2016-11-13 NOTE — Patient Instructions (Addendum)
Medication Instructions: - Your physician has recommended you make the following change in your medication:  1) Altace (ramipril) 10 mg- take one capsule (10 mg) by mouth twice daily  Labwork: - Your physician recommends that you have lab work today: BMP/ CBC  Procedures/Testing: - none ordered  Follow-Up: - Your physician wants you to follow-up in: 6 months with Dr. Caryl Comes. You will receive a reminder letter in the mail two months in advance. If you don't receive a letter, please call our office to schedule the follow-up appointment.  Any Additional Special Instructions Will Be Listed Below (If Applicable).     If you need a refill on your cardiac medications before your next appointment, please call your pharmacy.

## 2016-11-14 LAB — CBC WITH DIFFERENTIAL/PLATELET
BASOS ABS: 0 10*3/uL (ref 0.0–0.2)
Basos: 1 %
EOS (ABSOLUTE): 0.2 10*3/uL (ref 0.0–0.4)
Eos: 4 %
HEMATOCRIT: 44.5 % (ref 37.5–51.0)
Hemoglobin: 15.1 g/dL (ref 13.0–17.7)
Immature Grans (Abs): 0 10*3/uL (ref 0.0–0.1)
Immature Granulocytes: 0 %
LYMPHS ABS: 1.1 10*3/uL (ref 0.7–3.1)
Lymphs: 29 %
MCH: 30.1 pg (ref 26.6–33.0)
MCHC: 33.9 g/dL (ref 31.5–35.7)
MCV: 89 fL (ref 79–97)
MONOS ABS: 0.4 10*3/uL (ref 0.1–0.9)
Monocytes: 11 %
NEUTROS ABS: 2.2 10*3/uL (ref 1.4–7.0)
Neutrophils: 55 %
Platelets: 189 10*3/uL (ref 150–379)
RBC: 5.01 x10E6/uL (ref 4.14–5.80)
RDW: 13.8 % (ref 12.3–15.4)
WBC: 3.9 10*3/uL (ref 3.4–10.8)

## 2016-11-14 LAB — BASIC METABOLIC PANEL
BUN / CREAT RATIO: 21 (ref 10–24)
BUN: 19 mg/dL (ref 8–27)
CHLORIDE: 97 mmol/L (ref 96–106)
CO2: 23 mmol/L (ref 18–29)
Calcium: 9.3 mg/dL (ref 8.6–10.2)
Creatinine, Ser: 0.9 mg/dL (ref 0.76–1.27)
GFR calc Af Amer: 100 (ref >59)
GFR calc non Af Amer: 86 (ref >59)
GLUCOSE: 95 mg/dL (ref 65–99)
Potassium: 4.6 mmol/L (ref 3.5–5.2)
SODIUM: 138 mmol/L (ref 134–144)

## 2016-12-04 ENCOUNTER — Other Ambulatory Visit: Payer: Self-pay | Admitting: Internal Medicine

## 2017-01-02 ENCOUNTER — Other Ambulatory Visit: Payer: Self-pay | Admitting: Internal Medicine

## 2017-05-22 ENCOUNTER — Other Ambulatory Visit: Payer: Self-pay | Admitting: Internal Medicine

## 2017-05-23 NOTE — Telephone Encounter (Signed)
Pt last saw Dr Caryl Comes 11/13/16, last labs 11/13/16 Creat 0.90, age 71, weight 111.6kg, based on specified criteria pt is on appropriate dosage Eliquis 5mg  BID.  Will refill rx.

## 2017-12-12 ENCOUNTER — Other Ambulatory Visit: Payer: Self-pay | Admitting: *Deleted

## 2017-12-17 NOTE — Progress Notes (Signed)
Electrophysiology Office Note Date: 12/24/2017  ID:  [name] [name], DOB 10/22/1945, MRN [id number]  PCP: Neena Rhymes, MD Electrophysiologist: Caryl Comes   CC: AF follow up  [name] [name] is a 72 y.o. male seen today for Dr Caryl Comes.  He presents today for routine electrophysiology followup.  Since last being seen in our clinic, the patient reports doing very well.  He has not had recurrent AF.  He does have occasional extra beats. He continues to be very active working out for an hour most days.  He still works full time.  He denies chest pain, dyspnea, PND, orthopnea, nausea, vomiting, dizziness, syncope, edema, weight gain, or early satiety.  Past Medical History:  Diagnosis Date  . Atrial fibrillation ([place])   . CAD (coronary artery disease)   . Cancer ([place])    SKIN  . Cardiac arrhythmia due to congenital heart disease   . Cataract, nonsenile   . COPD (chronic obstructive pulmonary disease) ([place])   . DJD (degenerative joint disease)   . Finger, superficial foreign body (splinter), without major open wound, infected   . GERD (gastroesophageal reflux disease)   . HTN (hypertension)   . Leukopenia   . Osteoporosis   . Sleep apnea   . TIA (transient ischemic attack) 1990s   transient speech deficit   Past Surgical History:  Procedure Laterality Date  . CATARACT EXTRACTION    . RF ablation for A. Fib '09 - Dr. Rolland Porter, . x 2    . TONSILLECTOMY      Current Outpatient Medications  Medication Sig Dispense Refill  . alfuzosin (UROXATRAL) 10 MG 24 hr tablet Take 10 mg by mouth daily.      Marland Kitchen apixaban (ELIQUIS) 5 MG TABS tablet Take 1 tablet (5 mg total) by mouth 2 (two) times daily. 60 tablet 12  . atorvastatin (LIPITOR) 10 MG tablet Take 10 mg by mouth daily.    Marland Kitchen atorvastatin (LIPITOR) 20 MG tablet Take 0.5 tablets (10 mg total) by mouth daily. 30 tablet 10  . b-complex w/C (TOTAL B/C) TABS tablet Take 1 tablet by mouth daily.    . calcium-vitamin D (OSCAL WITH D)  500-200 MG-UNIT per tablet 2 tabs po am and 1 tab po pm     . cloNIDine (CATAPRES) 0.1 MG tablet TAKE 1/2 TAB EVERY 2 HOURS FOR SBP 160 TO 180, AND 1 TAB EVERY 2 HOURS FOR SBP 180; as needed    . co-enzyme Q-10 30 MG capsule Take 100 mg by mouth 4 (four) times daily as needed.     Marland Kitchen FIBER PO Take 5-6 capsules by mouth daily.    . finasteride (PROSCAR) 5 MG tablet Take 5 mg by mouth daily.      . metoprolol succinate (TOPROL-XL) 50 MG 24 hr tablet Take 50 mg by mouth daily. Take with or immediately following a meal.    . polyethylene glycol (MIRALAX / GLYCOLAX) packet Take 17 g by mouth daily.    . Probiotic Product (PROBIOTIC DAILY PO) Take 10 mg by mouth daily.    . ramipril (ALTACE) 10 MG capsule take 2 capsules by mouth once daily 180 capsule 3   No current facility-administered medications for this visit.     Allergies:   Patient has no known allergies.   Social History: Social History   Socioeconomic History  . Marital status: Married    Spouse name: Not on file  . Number of children: 2  . Years of education:  19  . Highest education level: Not on file  Occupational History  . Occupation: ATTORNEY    Employer: [employer]  . Financial resource strain: Not on file  . Food insecurity:    Worry: Not on file    Inability: Not on file  . Transportation needs:    Medical: Not on file    Non-medical: Not on file  Tobacco Use  . Smoking status: Never Smoker  . Smokeless tobacco: Never Used  Substance and Sexual Activity  . Alcohol use: Yes  . Drug use: No  . Sexual activity: Yes    Partners: Female  Lifestyle  . Physical activity:    Days per week: Not on file    Minutes per session: Not on file  . Stress: Not on file  Relationships  . Social connections:    Talks on phone: Not on file    Gets together: Not on file    Attends religious service: Not on file    Active member of club or organization: Not on file    Attends meetings of clubs or  organizations: Not on file    Relationship status: Not on file  . Intimate partner violence:    Fear of current or ex partner: Not on file    Emotionally abused: Not on file    Physically abused: Not on file    Forced sexual activity: Not on file  Other Topics Concern  . Not on file  Social History Narrative   [employer]; [employer]. Married '79. 2 daughters - '80, '82. His daughter's rowing team won the world championship in [place] '11. work: [employer]. Marriage in good health. Advanced Care Planning - discussed. He is aware of these issues. States he would not want futile care or prolonged advanced heroic measures in the face of irreversible illness.                 Family History: Family History  Problem Relation Age of Onset  . Heart attack Father   . Coronary artery disease Father   . Transient ischemic attack Mother   . Coronary artery disease Mother        h/o TIA   . Stroke Mother   . Colon cancer Other        aunt and uncle    Review of Systems: All other systems reviewed and are otherwise negative except as noted above.   Physical Exam: VS:  BP 120/88   Pulse 76   Ht 6\' 6"  (1.981 m)   Wt 245 lb (111.1 kg)   SpO2 97%   BMI 28.31 kg/m  , BMI Body mass index is 28.31 kg/m. Wt Readings from Last 3 Encounters:  12/24/17 245 lb (111.1 kg)  11/13/16 246 lb (111.6 kg)  03/22/16 238 lb (108 kg)    GEN- The patient is well appearing, alert and oriented x 3 today.   HEENT: normocephalic, atraumatic; sclera clear, conjunctiva pink; hearing intact; oropharynx clear; neck supple Lungs- Clear to ausculation bilaterally, normal work of breathing.  No wheezes, rales, rhonchi Heart- Regular rate and rhythm GI- soft, non-tender, non-distended, bowel sounds present, Extremities- no clubbing, cyanosis, or edema MS- no significant deformity or atrophy Skin- warm and dry, no rash or lesion  Psych- euthymic mood, full affect Neuro-  strength and sensation are intact   EKG:  EKG is ordered today. The ekg ordered today shows sinus rhythm, 1st degree AV block, rate  76  Recent Labs: No results found for requested labs within last 8760 hours.    Other studies Reviewed: Additional studies/ records that were reviewed today include: Dr Olin Pia office notes  Assessment and Plan:  1.  Paroxysmal atrial fibrillation Maintaining SR by symptoms Continue Eliquis for CHADS2VASC of at least 3 CBC, BMET today   2.  HTN Stable No change required today  3.  OSA Compliance with CPAP encouraged  4.  CAD No recent ischemic symptoms Continue current therapy  He asks about appropriate Lipitor dose today. He has been doing 10mg  daily for several months. Will check lipids today    Current medicines are reviewed at length with the patient today.   The patient does not have concerns regarding his medicines.  The following changes were made today:  none  Labs/ tests ordered today include:  Orders Placed This Encounter  Procedures  . CBC  . Basic metabolic panel  . Lipid panel  . EKG 12-Lead     Disposition:   Follow up with Dr Caryl Comes in 1 year     Signed, Chanetta Marshall, NP 12/24/2017 8:43 AM   [facility] [address] [place] [address] [phone] (office) [phone] (fax)

## 2017-12-23 NOTE — Telephone Encounter (Signed)
°*  STAT* If patient is at the pharmacy, call can be transferred to refill team.   1. Which medications need to be refilled? (please list name of each medication and dose if known) Eliquis 5mg   2. Which pharmacy/location (including street and city if local pharmacy) is medication to be sent to?[facility] [phone] 3. Do they need a 30 day or 90 day supply? 60 and refills

## 2017-12-24 ENCOUNTER — Encounter: Payer: Self-pay | Admitting: Nurse Practitioner

## 2017-12-24 ENCOUNTER — Ambulatory Visit (INDEPENDENT_AMBULATORY_CARE_PROVIDER_SITE_OTHER): Payer: BLUE CROSS/BLUE SHIELD | Admitting: Nurse Practitioner

## 2017-12-24 VITALS — BP 120/88 | HR 76 | Ht 78.0 in | Wt 245.0 lb

## 2017-12-24 DIAGNOSIS — I10 Essential (primary) hypertension: Secondary | ICD-10-CM

## 2017-12-24 DIAGNOSIS — G4733 Obstructive sleep apnea (adult) (pediatric): Secondary | ICD-10-CM | POA: Diagnosis not present

## 2017-12-24 DIAGNOSIS — I480 Paroxysmal atrial fibrillation: Secondary | ICD-10-CM | POA: Diagnosis not present

## 2017-12-24 DIAGNOSIS — Z9989 Dependence on other enabling machines and devices: Secondary | ICD-10-CM | POA: Diagnosis not present

## 2017-12-24 LAB — BASIC METABOLIC PANEL
BUN / CREAT RATIO: 18 (ref 10–24)
BUN: 18 mg/dL (ref 8–27)
CALCIUM: 9.4 mg/dL (ref 8.6–10.2)
CHLORIDE: 102 mmol/L (ref 96–106)
CO2: 23 mmol/L (ref 20–29)
CREATININE: 1.02 mg/dL (ref 0.76–1.27)
GFR calc Af Amer: 84 mL/min/{1.73_m2} (ref >59)
GFR calc non Af Amer: 73 mL/min/{1.73_m2} (ref >59)
GLUCOSE: 100 mg/dL — AB (ref 65–99)
Potassium: 4.9 mmol/L (ref 3.5–5.2)
Sodium: 138 mmol/L (ref 134–144)

## 2017-12-24 LAB — CBC
HEMOGLOBIN: 14.7 g/dL (ref 13.0–17.7)
Hematocrit: 43 % (ref 37.5–51.0)
MCH: 30.4 pg (ref 26.6–33.0)
MCHC: 34.2 g/dL (ref 31.5–35.7)
MCV: 89 fL (ref 79–97)
Platelets: 203 10*3/uL (ref 150–379)
RBC: 4.84 x10E6/uL (ref 4.14–5.80)
RDW: 13.3 % (ref 12.3–15.4)
WBC: 4.4 10*3/uL (ref 3.4–10.8)

## 2017-12-24 LAB — LIPID PANEL
CHOLESTEROL TOTAL: 128 mg/dL (ref 100–199)
Chol/HDL Ratio: 2.2 ratio (ref 0.0–5.0)
HDL: 58 mg/dL (ref >39)
LDL CALC: 62 mg/dL (ref 0–99)
TRIGLYCERIDES: 40 mg/dL (ref 0–149)
VLDL CHOLESTEROL CAL: 8 mg/dL (ref 5–40)

## 2017-12-24 MED ORDER — APIXABAN 5 MG PO TABS: 5.0000 mg | ORAL_TABLET | Freq: Two times a day (BID) | ORAL | 12 refills | Status: DC

## 2017-12-24 NOTE — Patient Instructions (Addendum)
Medication Instructions:   Your physician recommends that you continue on your current medications as directed. Please refer to the Current Medication list given to you today.   If you need a refill on your cardiac medications before your next appointment, please call your pharmacy.  Labwork:  CBC BMET AND .LIPIDS   Testing/Procedures: NONE ORDERED  TODAY    Follow-Up:  Your physician wants you to follow-up in: [place] will receive a reminder letter in the mail two months in advance. If you don't receive a letter, please call our office to schedule the follow-up appointment.     Any Other Special Instructions Will Be Listed Below (If Applicable).

## 2017-12-30 ENCOUNTER — Other Ambulatory Visit: Payer: Self-pay | Admitting: Nurse Practitioner

## 2017-12-30 MED ORDER — RAMIPRIL 10 MG PO CAPS: 20.0000 mg | ORAL_CAPSULE | Freq: Every day | ORAL | 3 refills | Status: DC

## 2018-02-18 ENCOUNTER — Encounter: Payer: Self-pay | Admitting: Cardiology

## 2018-02-19 LAB — PULMONARY FUNCTION TEST

## 2018-02-24 ENCOUNTER — Telehealth: Payer: Self-pay | Admitting: Internal Medicine

## 2018-02-24 NOTE — Telephone Encounter (Signed)
Raquel Sarna see below note about patient paperwork. Thank you!

## 2018-02-24 NOTE — Telephone Encounter (Signed)
Called and spoke with pt who stated Dr. Jannetta Quint had recommended pt to be seen by MR but after the diagnosis of pulmonary htn, it was determined that Dr. Loralie Champagne would be the best person for pt to see.  The consult appt that was scheduled for pt to see MR 04/10/18 was cancelled and pt states he does not need a consult at our office.  Pt asked me if I could send all the records that was sent to our office to Dr. Loralie Champagne as that is who he will be seeing for the visit.  Will fax pt's records to Dr. Elby Showers office.  Dr. Aundra Dubin, I have faxed records that was brought to our office by pt in attention to you. Records were faxed at 5:30 6/3.  Please advise when you have received records on pt from our office. I do have original copies if the fax does not go through and we can arrange to get the records to you before pt's visit.

## 2018-02-25 NOTE — Telephone Encounter (Signed)
I just refaxed all the documents to the fax provided by [facility]  Please let me know if you do received the documents or if you still have not received them.

## 2018-02-25 NOTE — Telephone Encounter (Signed)
Thanks Raquel Sarna, I have received the records, pt is sch to see Dr Aundra Dubin on 6/13

## 2018-02-25 NOTE — Telephone Encounter (Signed)
We have not seen the faxed paperwork yet.

## 2018-02-25 NOTE — Telephone Encounter (Signed)
[name] [name], can you please fax those records to [phone] thanks

## 2018-03-02 ENCOUNTER — Encounter (HOSPITAL_COMMUNITY): Payer: Self-pay | Admitting: Cardiology

## 2018-03-06 ENCOUNTER — Encounter (HOSPITAL_COMMUNITY): Payer: Self-pay | Admitting: Cardiology

## 2018-03-06 ENCOUNTER — Ambulatory Visit (HOSPITAL_COMMUNITY)
Admission: RE | Admit: 2018-03-06 | Discharge: 2018-03-06 | Disposition: A | Discharge status: Home / Self Care | Payer: BC Managed Care – PPO | Source: Ambulatory Visit | Attending: Cardiology | Admitting: Cardiology

## 2018-03-06 VITALS — BP 140/82 | HR 77 | Wt 240.0 lb

## 2018-03-06 DIAGNOSIS — G4733 Obstructive sleep apnea (adult) (pediatric): Secondary | ICD-10-CM | POA: Diagnosis not present

## 2018-03-06 DIAGNOSIS — N400 Enlarged prostate without lower urinary tract symptoms: Secondary | ICD-10-CM | POA: Diagnosis not present

## 2018-03-06 DIAGNOSIS — I2510 Atherosclerotic heart disease of native coronary artery without angina pectoris: Secondary | ICD-10-CM | POA: Diagnosis not present

## 2018-03-06 DIAGNOSIS — I480 Paroxysmal atrial fibrillation: Secondary | ICD-10-CM | POA: Insufficient documentation

## 2018-03-06 DIAGNOSIS — I272 Pulmonary hypertension, unspecified: Secondary | ICD-10-CM | POA: Insufficient documentation

## 2018-03-06 DIAGNOSIS — Z9889 Other specified postprocedural states: Secondary | ICD-10-CM | POA: Diagnosis not present

## 2018-03-06 DIAGNOSIS — Z79899 Other long term (current) drug therapy: Secondary | ICD-10-CM | POA: Insufficient documentation

## 2018-03-06 DIAGNOSIS — I10 Essential (primary) hypertension: Secondary | ICD-10-CM | POA: Diagnosis not present

## 2018-03-06 DIAGNOSIS — E785 Hyperlipidemia, unspecified: Secondary | ICD-10-CM | POA: Diagnosis not present

## 2018-03-06 DIAGNOSIS — R55 Syncope and collapse: Secondary | ICD-10-CM | POA: Diagnosis not present

## 2018-03-06 DIAGNOSIS — Z8673 Personal history of transient ischemic attack (TIA), and cerebral infarction without residual deficits: Secondary | ICD-10-CM | POA: Insufficient documentation

## 2018-03-06 DIAGNOSIS — Z800 Family history of malignant neoplasm of digestive organs: Secondary | ICD-10-CM | POA: Diagnosis not present

## 2018-03-06 DIAGNOSIS — Z7901 Long term (current) use of anticoagulants: Secondary | ICD-10-CM | POA: Diagnosis not present

## 2018-03-06 DIAGNOSIS — K219 Gastro-esophageal reflux disease without esophagitis: Secondary | ICD-10-CM | POA: Diagnosis not present

## 2018-03-06 DIAGNOSIS — I4891 Unspecified atrial fibrillation: Secondary | ICD-10-CM

## 2018-03-06 DIAGNOSIS — Z9989 Dependence on other enabling machines and devices: Secondary | ICD-10-CM

## 2018-03-06 DIAGNOSIS — Z8249 Family history of ischemic heart disease and other diseases of the circulatory system: Secondary | ICD-10-CM | POA: Diagnosis not present

## 2018-03-06 MED ORDER — NIFEDIPINE ER OSMOTIC RELEASE 60 MG PO TB24: 60.0000 mg | ORAL_TABLET | Freq: Every day | ORAL | 3 refills | Status: DC

## 2018-03-06 NOTE — Progress Notes (Signed)
Cardiology: Dr. Aundra Dubin EP: Dr. Caryl Comes  72 y.o. with history of nonobstructive CAD and paroxysmal atrial fibrillation presents for evaluation of pulmonary hypertension.  Patient had ablations for atrial fibrillation in 2009 and 2010, he has been primarily in NSR since that time. He has OSA and uses CPAP regularly.  He had a cath in 2007 with nonobstructive CAD and a Cardiolite in 2017 without ischemia.  He has not had a recent echo in [place].    For several months, he has been noting increased fatigue with exercise.  He exercises very regularly, walks on a treadmill at an incline.  He noted that this had been becoming harder.  Not short of breath per se, and no dyspnea walking on flat ground or up a flight of stairs.  In 5/19, he was in [place], [place].  He was on a treadmill doing his usual routine when he began to feel "funny."  He had an odd feeling in his abdomen.  He lost consciousness and fell to the ground.  He was not unconscious for very long.  He was taken to the [facility].  While there, he had a very extensive workup.  Echo showed pulmonary hypertension with signs of RV failure.  V/Q scan showed no chronic PE and CT chest did not show significant abnormalities.  RHC was done, this showed pulmonary hypertension that appeared vasoreactive with NO testing (see PMH section below for numbers).  He was started on nifedipine XR.   Since getting home, he has been doing fine.  He cannot tell a difference with the nifedipine as he says he has not been working out as hard.  No further lightheadedness or syncope.    Labs (4/19): K 4.9, creatinine 1.02, LDL 62  6 minute walk (5/19): 616 m [facility])  ECG (4/19, personally reviewed): NSR,1st degree AVB  PMH: 1. Atrial fibrillation: Paroxysmal.  S/p ablation in 2009, then repeat ablation in [facility] at [facility].   2. H/o TIA in 1990s 3. GERD 4. HTN 5. Hyperlipidemia 6. BPH 7. OSA: Uses CPAP 8. CAD: LHC in 2007 with nonobstructive disease.   - Cardiolite in 2017 did not show ischemia or infarction.  9. Pulmonary hypertension:  Diagnosed after syncopal episode on treadmill in 5/19.  - V/Q scan (5/19): No evidence for chronic PE - ANA, TSH, HIV negative (5/19) - CT chest (5/19): No evidence for ILD.  - PFTs (5/19): Mild obstruction.  - Echo (5/19): EF 65-70%, IV septal flattening with moderately enlarged RV with moderately decreased systolic function. PASP 74 mmHg.  - RHC (5/19): Initial RHC with mean RA 4, RV 62/7, PA 68/19 mean 40, PCWP 22, CI 2.45, PVR 3 WU => primarily pulmonary venous hypertension with no evidence for shunt lesion (PA sat 70%, RA sat 69%).  He was then given NTG sprays and RHC was repeated, showing fall in PCWP out of proportion to fall in PCWP and rise in PVR to 3.46 => unmasked PAH.  Next, he was started on inhaled NO and numbers were repeated: mean PA 19, PCWP 9, CI 2.7, PVR 1.5 => correction with NO, so vasoreactive.  - Start on nifedipine CR as vasoreactive.   SH: Married, lives in [place], 2 daughters, [employer], never smoked.   Family History  Problem Relation Age of Onset  . Heart attack Father   . Coronary artery disease Father   . Transient ischemic attack Mother   . Coronary artery disease Mother  h/o TIA   . Stroke Mother   . Colon cancer Other        aunt and uncle   ROS: All systems reviewed and negative except as per HPI.   Current Outpatient Medications  Medication Sig Dispense Refill  . alfuzosin (UROXATRAL) 10 MG 24 hr tablet Take 10 mg by mouth daily.      Marland Kitchen apixaban (ELIQUIS) 5 MG TABS tablet Take 1 tablet (5 mg total) by mouth 2 (two) times daily. 60 tablet 12  . atorvastatin (LIPITOR) 10 MG tablet Take 10 mg by mouth daily.    Marland Kitchen atorvastatin (LIPITOR) 20 MG tablet Take 0.5 tablets (10 mg total) by mouth daily. 30 tablet 10  . b-complex w/C (TOTAL B/C) TABS tablet Take 1 tablet by mouth daily.    . calcium-vitamin D (OSCAL WITH D) 500-200 MG-UNIT  per tablet 2 tabs po am and 1 tab po pm     . cloNIDine (CATAPRES) 0.1 MG tablet TAKE 1/2 TAB EVERY 2 HOURS FOR SBP 160 TO 180, AND 1 TAB EVERY 2 HOURS FOR SBP 180; as needed    . co-enzyme Q-10 30 MG capsule Take 100 mg by mouth 4 (four) times daily as needed.     Marland Kitchen FIBER PO Take 5-6 capsules by mouth daily.    . finasteride (PROSCAR) 5 MG tablet Take 5 mg by mouth daily.      . metoprolol succinate (TOPROL-XL) 50 MG 24 hr tablet Take 50 mg by mouth daily. Take with or immediately following a meal.    . NIFEdipine (PROCARDIA XL/ADALAT-CC) 60 MG 24 hr tablet Take 1 tablet (60 mg total) by mouth daily. 90 tablet 3  . polyethylene glycol (MIRALAX / GLYCOLAX) packet Take 17 g by mouth daily.    . Probiotic Product (PROBIOTIC DAILY PO) Take 10 mg by mouth daily.    . ramipril (ALTACE) 10 MG capsule Take 2 capsules (20 mg total) by mouth daily. 180 capsule 3   No current facility-administered medications for this encounter.    BP 140/82   Pulse 77   Wt 240 lb (108.9 kg)   SpO2 95%   BMI 27.73 kg/m  General: NAD Neck: No JVD, no thyromegaly or thyroid nodule.  Lungs: Clear to auscultation bilaterally with normal respiratory effort. CV: Nondisplaced PMI.  Heart regular S1/S2, no S3/S4, no murmur.  No peripheral edema.  No carotid bruit.  Normal pedal pulses.  Abdomen: Soft, nontender, no hepatosplenomegaly, no distention.  Skin: Intact without lesions or rashes.  Neurologic: Alert and oriented x 3.  Psych: Normal affect. Extremities: No clubbing or cyanosis.  HEENT: Normal.   Assessment/Plan: 1. Pulmonary hypertension: This may be the cause of his syncopal episode in 5/19. Echo in 5/19 showed elevated PA pressure with RV dysfunction.  V/Q scan and CT chest were negative.  Serologic workup was negative. Initial RHC looked like pulmonary venous hypertension but NTG spray was able to unmask PAH.  NO challenge in the cath lab led to a significant response with elimination of PH and fall in PVR to  1.5 WU. Therefore, patient was thought to have vasoreactive PH and was started on nifedipine XR 30 mg daily.  This type of PH is thought to have better prognosis.  Goal will be to gradually increase nifedipine XR dose and to follow clinical symptoms, echo, and BNP. If PH/RV function do not improve as desired with nifedipine XR, selective pulmonary vasodilators remain an option down the road. He has OSA but  this is treated with CPAP. OSA does not explain the extent of his PH.  NYHA class II currently, not volume overloaded on exam.  - Increase nifedipine XR to 60 mg daily today.  SBP running 130s-140s at home.  - He is going back to [facility] in [place] in 7/19, will have an echo and 6 minute walk at that time so we will not do either of those today.  I should be able to see the test results on Care Everywhere.  2. Syncope: It is certainly possible that his exertional syncope was due to untreated pulmonary hypertension.  However, cannot fully rule out arrhythmia.  - I will try to arrange for 2 week Zio patch for him.  3. Atrial fibrillation: Paroxysmal.  He is in NSR by exam today.  - Continue Eliquis.  - Continue Toprol XL.  4. HTN: BP running on the high side, we will be increasing nifedipine XR today.  5. OSA: Using CPAP.  6. CAD: Nonobstructive on 2007 cath.  He is on atorvastatin. Good lipids in 4/19.  No ASA given apixaban use.   Loralie Champagne 03/06/2018

## 2018-03-06 NOTE — Patient Instructions (Signed)
Increase Nifedipine 60 mg (1 tab) daily  Your physician has recommended that you wear a holter monitor. Holter monitors are medical devices that record the heart's electrical activity. Doctors most often use these monitors to diagnose arrhythmias. Arrhythmias are problems with the speed or rhythm of the heartbeat. The monitor is a small, portable device. You can wear one while you do your normal daily activities. This is usually used to diagnose what is causing palpitations/syncope (passing out).   Your physician recommends that you schedule a follow-up appointment in: September with Dr. Aundra Dubin

## 2018-03-18 ENCOUNTER — Ambulatory Visit (INDEPENDENT_AMBULATORY_CARE_PROVIDER_SITE_OTHER): Payer: BC Managed Care – PPO

## 2018-03-18 DIAGNOSIS — R55 Syncope and collapse: Secondary | ICD-10-CM | POA: Diagnosis not present

## 2018-03-21 ENCOUNTER — Telehealth: Payer: Self-pay | Admitting: Internal Medicine

## 2018-03-21 NOTE — Telephone Encounter (Signed)
[name] [name]   I am seeing [name] [name] 1945-10-11= on 05/15/18. His friend Dr Coralie Keens called me 11:56 AM 03/21/2018 and said he wants patient seen before 04/12/18 or 04/14/18. Maye work him into a Pension scheme manager with [facility] or prefer other open slot  THanks  Dr. Brand Males, M.D., F.C.C.P Pulmonary and Critical Care Medicine Staff Physician, [place] Director - Interstitial Lung Disease  Program  Pulmonary [place] [facility] [place], [place], [address]  Pager: [phone], If no answer or between  15:00h - 7:00h: call [phone] Telephone: [phone]

## 2018-03-21 NOTE — Telephone Encounter (Signed)
Called and spoke with Anderson Malta down in [facility] to see if there was a research pt scheduled on either 7/19 or 7/22 and per Anderson Malta, at this time there is no research pt scheduled to come in on those days.  Called pt to see if he would be able to come in for the consult on 7/22 at 10:30 and pt stated he could.  Pt's appt was changed from 8/22 with MR to 7/22 with MR. Pt has been told to arrive 15 minutes early for check in.  Nothing further needed.

## 2018-04-01 ENCOUNTER — Telehealth: Payer: Self-pay | Admitting: Internal Medicine

## 2018-04-01 NOTE — Telephone Encounter (Signed)
Noted. Will give records to MR when pt comes for the consult.

## 2018-04-10 ENCOUNTER — Institutional Professional Consult (permissible substitution): Payer: BC Managed Care – PPO | Admitting: Internal Medicine

## 2018-04-14 ENCOUNTER — Encounter: Payer: Self-pay | Admitting: Internal Medicine

## 2018-04-14 ENCOUNTER — Ambulatory Visit (INDEPENDENT_AMBULATORY_CARE_PROVIDER_SITE_OTHER): Payer: BC Managed Care – PPO | Admitting: Internal Medicine

## 2018-04-14 ENCOUNTER — Other Ambulatory Visit: Payer: BC Managed Care – PPO

## 2018-04-14 VITALS — BP 100/58 | HR 85 | Ht 77.0 in | Wt 226.2 lb

## 2018-04-14 DIAGNOSIS — J449 Chronic obstructive pulmonary disease, unspecified: Secondary | ICD-10-CM

## 2018-04-14 MED ORDER — ALBUTEROL SULFATE HFA 108 (90 BASE) MCG/ACT IN AERS: 2.0000 | INHALATION_SPRAY | Freq: Four times a day (QID) | RESPIRATORY_TRACT | 6 refills | Status: DC | PRN

## 2018-04-14 MED ORDER — TIOTROPIUM BROMIDE MONOHYDRATE 2.5 MCG/ACT IN AERS: 2.0000 | INHALATION_SPRAY | Freq: Every day | RESPIRATORY_TRACT | 0 refills | Status: DC

## 2018-04-14 NOTE — Patient Instructions (Signed)
ICD-10-CM   1. Chronic obstructive pulmonary disease, unspecified COPD type ([place]) J44.9     Check alpha 1 AT genetic phenotype blood work (I forgot to mention this to you) Start empiric spiriva inhaler daily x 6 weeks Start albuterol as needed If cough becomes a big issue, will have to consider stopping ramipril - ok to continue for now Keep up [facility] appointment with cardiology in July/Aug and Pulmonary in Oct 2019  Followup 6-8 weeks with Dr Chase Caller - CAT score at followup

## 2018-04-14 NOTE — Progress Notes (Signed)
Subjective:    [name], male    DOB: 09-14-1946, 72 y.o.   MRN: [id number]  PCP [name] Huddle, MD   HPI  IOV 04/14/2018  Chief Complaint  Patient presents with  . Pulmonary Consult    [name]. 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 He presents at the request of his friend [name] [name] retired cardiologist. He has been recently diagnosed with COPD and pulmonary venous and arterial hypertension at [name] in [place]. His primary residence s in [place], [place]. His son-in-law's a nephrologist at [name] and therefore he frequents [name]. He was advised that he needs a local pulmonologist and cardiologist. He has recently registered with [name]. Loralie [name] in cardiology for his pulmonary hypertension.history is gained by talking to him and review of the [name] records.  Just before Memorial Day 2019 he had a syncopal episode on the treadmill at 4.8 miles per hour and had laceration . Workup at this time revealed pulmonary venous hypertension and pulmonary arterial hypertension which was basal dilator responsive. All the details in [name] records and [name]. Aundra [name] records. He has been started on niffedipine which has been increased by [name]. Aundra [name] on office visit 03/06/2018 here in [name]. Since that time he overall is feeling better. He is currently working out on the treadmill at 4.2 miles an hourand he thinks he is better although he is not challenge himself at 4.8 miles an hour. Review of the records indicate that he does have sleep apnea for which he takes CPAP? Followed by [name]. Inda [name] in [name]. Pulmonary function test 02/19/2018 at the [name] shows moderate obstructive lung disease without bronchodilator response and normal DLCO. His FEV1 is 2.84 L/69% and a ratio  55. DLCO is normal at 92. His total lung capacity and residual volumes show hyperinflation with air trapping at 119% and 192% respectively.he tells me approximately 10 years ago he had pulmonary function test while on amiodarone and this did show "some amount of COPD". Because he was feeling well otherwise not much attentionto this. We do not have those test results  He's had a VQ scan that did not show any evidence of chronic PE. His HIV and ANA were negative. He had a CT chest at [name] that I review the results and for normal parenchyma. He had a CT chest here in 2007. Lung images were visualized by myself and it id not show any evidence of emphysema  He has a follow-up coming up at [name] with cardiology it is been referred to [name] pulmonary for October 2019. Currently he has some amount of symptoms with some mild cough that he thinks is because of postnasal drip although he is on an ACE inhibitor  His current symptomatology some amount of cough and shortness of breath with the COPD cat score of 13. He does have postnasal drip. He occasionally has a grossly noise in the night but denies any wheezing. He has not been on any inhaler therapy  CAT COPD Symptom & Quality of Life Score (GSK trademark) 0 is no burden. 5 is highest burden 04/14/2018   Never Cough -> Cough all the time 2  No phlegm in chest -> Chest is full of phlegm 0  No chest tightness -> Chest feels very tight  1  No dyspnea for 1 flight stairs/hill -> Very dyspneic for 1 flight of stairs 2  No limitations for ADL at home -> Very limited with ADL at home 1  Confident leaving home -> Not at all confident leaving home 1  Sleep soundly -> Do not sleep soundly because of lung condition 2  Lots of Energy -> No energy at all 3  TOTAL Score (max 40)  13          has a past medical history of Atrial fibrillation ([place]), CAD (coronary artery disease), Cancer ([place]), 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   reports that he has never smoked. He has never used smokeless tobacco.  Past Surgical History:  Procedure Laterality Date  . CATARACT EXTRACTION    . RF ablation for A. Fib '09 - [name]. Rolland Porter, [place]. x 2    . TONSILLECTOMY      No Known Allergies  Immunization History  Administered Date(s) Administered  . Influenza Split 07/01/2012  . Influenza Whole 06/14/2008, 06/27/2010  . Influenza,inj,Quad PF,6+ Mos 06/17/2013, 05/01/2016  . Pneumococcal Conjugate-13 11/09/2013, 04/22/2014  . Pneumococcal Polysaccharide-23 06/27/2010  . Td 10/17/2007  . Tdap 07/01/2012  . Zoster 04/09/2006    Family History  Problem Relation Age of Onset  . Heart attack Father   . Coronary artery disease Father   . Transient ischemic attack Mother   . Coronary artery disease Mother        h/o TIA   . Stroke Mother   . Colon cancer Other        aunt and uncle     Current Outpatient Medications:  .  alfuzosin (UROXATRAL) 10 MG 24 hr tablet, Take 10 mg by mouth daily.  , Disp: , Rfl:  .  apixaban (ELIQUIS) 5 MG TABS tablet, Take 1 tablet (5 mg total) by mouth 2 (two) times daily., Disp: 60 tablet, Rfl: 12 .  atorvastatin (LIPITOR) 10 MG tablet, Take 10 mg by mouth daily., Disp: , Rfl:  .  b-complex w/C (TOTAL B/C) TABS tablet, Take 1 tablet by mouth daily., Disp: , Rfl:  .  calcium-vitamin D (OSCAL WITH D) 500-200 MG-UNIT per tablet, 2 tabs po am and 1 tab po pm , Disp: , Rfl:  .  cloNIDine (CATAPRES) 0.1 MG tablet, TAKE 1/2 TAB EVERY 2 HOURS FOR SBP 160 TO 180, AND 1 TAB EVERY 2 HOURS FOR SBP 180; as needed, Disp: , Rfl:  .  co-enzyme Q-10 30 MG capsule, Take 100 mg by mouth 4  (four) times daily as needed. , Disp: , Rfl:  .  FIBER PO, Take 5-6 capsules by mouth daily., Disp: , Rfl:  .  finasteride (PROSCAR) 5 MG tablet, Take 5 mg by mouth daily.  , Disp: , Rfl:  .  metoprolol succinate (TOPROL-XL) 50 MG 24 hr tablet, Take 50 mg by mouth daily. Take with or immediately following a meal., Disp: , Rfl:  .  NIFEdipine (PROCARDIA XL/ADALAT-CC) 60 MG 24 hr tablet, Take 1 tablet (60 mg total) by mouth daily., Disp: 90 tablet, Rfl: 3 .  Probiotic Product (PROBIOTIC DAILY PO), Take 10 mg by mouth daily., Disp: , Rfl:  .  ramipril (ALTACE) 10 MG capsule, Take 2 capsules (20 mg total) by  mouth daily., Disp: 180 capsule, Rfl: 3    Review of Systems  Constitutional: Negative for fever and unexpected weight change.  HENT: Positive for congestion. Negative for dental problem, ear pain, nosebleeds, postnasal drip, rhinorrhea, sinus pressure, sneezing, sore throat and trouble swallowing.   Eyes: Negative for redness and itching.  Respiratory: Negative for cough, chest tightness, shortness of breath and wheezing.   Cardiovascular: Negative for palpitations and leg swelling.  Gastrointestinal: Negative for nausea and vomiting.  Genitourinary: Negative for dysuria.  Musculoskeletal: Negative for joint swelling.  Skin: Negative for rash.  Neurological: Negative for headaches.  Hematological: Does not bruise/bleed easily.  Psychiatric/Behavioral: Negative for dysphoric mood. The patient is not nervous/anxious.        Objective:   Physical Exam  Constitutional: He is oriented to person, place, and time. He appears well-developed and well-nourished. No distress.  HENT:  Head: Normocephalic and atraumatic.  Right Ear: External ear normal.  Left Ear: External ear normal.  Mouth/Throat: Oropharynx is clear and moist. No oropharyngeal exudate.  Eyes: Pupils are equal, round, and reactive to light. Conjunctivae and EOM are normal. Right eye exhibits no discharge. Left eye exhibits no  discharge. No scleral icterus.  Neck: Normal range of motion. Neck supple. No JVD present. No tracheal deviation present. No thyromegaly present.  Cardiovascular: Normal rate, regular rhythm and intact distal pulses. Exam reveals no gallop and no friction rub.  No murmur heard. Pulmonary/Chest: Effort normal and breath sounds normal. No respiratory distress. He has no wheezes. He has no rales. He exhibits no tenderness.  Abdominal: Soft. Bowel sounds are normal. He exhibits no distension and no mass. There is no tenderness. There is no rebound and no guarding.  Musculoskeletal: Normal range of motion. He exhibits no edema or tenderness.  Lymphadenopathy:    He has no cervical adenopathy.  Neurological: He is alert and oriented to person, place, and time. He has normal reflexes. No cranial nerve deficit. Coordination normal.  Skin: Skin is warm and dry. No rash noted. He is not diaphoretic. No erythema. No pallor.  Psychiatric: He has a normal mood and affect. His behavior is normal. Judgment and thought content normal.  Nursing note and vitals reviewed.   Vitals:   04/14/18 1023  BP: (!) 100/58  Pulse: 85  SpO2: 95%  Weight: 226 lb 3.2 oz (102.6 kg)  Height: 6\' 5"  (1.956 m)    Estimated body mass index is 26.82 kg/m as calculated from the following:   Height as of this encounter: 6\' 5"  (1.956 m).   Weight as of this encounter: 226 lb 3.2 oz (102.6 kg).       Assessment & Plan:     ICD-10-CM   1. Chronic obstructive pulmonary disease, unspecified COPD type ([place]) J44.9    He never smoked. His DLCO is normal. Therefore is not supported by chronic bronchitis/emphysema patient. However he does not have classic asthma symptoms either. He seems to have based on history and current obstructive pattern on PFT some kind of a fixed obstructive lung disease. At this point in time we'll try him on empiric Spiriva or anticholinergic. If this does not work well we can try combination  long-acting beta agonist with inhaled corticosteroid. We will have to follow this with pulmonary function test and symptom score. We will also check an alpha 1. Will be interested to see what [facility] has to offer in terms of his pulmonary status for which he has an appointment in October 2019  Check alpha 1 AT genetic phenotype blood work (I forgot to mention this to you) Start empiric spiriva inhaler daily x 6 weeks Start albuterol as needed If cough becomes a big issue, will have to consider stopping ramipril - ok to continue for now Keep up [name] appointment with cardiology in July/Aug and Pulmonary in Oct 2019  Followup 6-8 weeks with [name] Chase Caller - CAT score at followup    [name]. Brand Males, M.D., [place] [name].C.P Pulmonary and Critical Care Medicine Staff Physician, [place] Director - Interstitial Lung Disease  Program  Pulmonary [place] [facility] [place], [place], [address]  Pager: [phone], If no answer or between  15:00h - 7:00h: call [phone] Telephone: [phone]

## 2018-04-18 LAB — ALPHA-1 ANTITRYPSIN PHENOTYPE: A-1 Antitrypsin, Ser: 156 mg/dL (ref 83–199)

## 2018-04-25 ENCOUNTER — Telehealth: Payer: Self-pay | Admitting: Internal Medicine

## 2018-04-25 NOTE — Telephone Encounter (Signed)
Called spoke with patient, advised of lab results as stated by MR:  Result Notes for Alpha-1 antitrypsin phenotype  Notes recorded by Pinion, Waldemar Dickens, CMA on 04/23/2018 at 4:59 PM EDT Attempted to call pt but no answer. Left message for pt to return call x1. 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 Patient stated that he is doing well with the Spiriva - does not need Rx at this time because he still has plenty of samples.  Patient will call when Rx is needed. Patient is aware to keep the 9.16.19 appt with MR and will call if anything is needed in the meantime. Nothing further needed, will sign off.

## 2018-05-08 ENCOUNTER — Telehealth: Payer: Self-pay | Admitting: Internal Medicine

## 2018-05-08 ENCOUNTER — Other Ambulatory Visit: Payer: Self-pay

## 2018-05-08 MED ORDER — TIOTROPIUM BROMIDE MONOHYDRATE 2.5 MCG/ACT IN AERS: 2.0000 | INHALATION_SPRAY | Freq: Every day | RESPIRATORY_TRACT | 3 refills | Status: DC

## 2018-05-08 NOTE — Telephone Encounter (Signed)
Advised pt of results. Pt understood and nothing further is needed.    Notes recorded by Brand Males, MD on 04/23/2018 at 3:21 PM EDT Alpha 1 is MM

## 2018-05-15 ENCOUNTER — Institutional Professional Consult (permissible substitution): Payer: BC Managed Care – PPO | Admitting: Internal Medicine

## 2018-05-31 ENCOUNTER — Encounter (HOSPITAL_COMMUNITY): Payer: Self-pay

## 2018-06-03 ENCOUNTER — Ambulatory Visit (HOSPITAL_COMMUNITY)
Admission: RE | Admit: 2018-06-03 | Discharge: 2018-06-03 | Disposition: A | Discharge status: Home / Self Care | Payer: BC Managed Care – PPO | Source: Ambulatory Visit | Attending: Cardiology | Admitting: Cardiology

## 2018-06-03 VITALS — BP 142/70 | HR 77 | Wt 217.8 lb

## 2018-06-03 DIAGNOSIS — Z8673 Personal history of transient ischemic attack (TIA), and cerebral infarction without residual deficits: Secondary | ICD-10-CM | POA: Diagnosis not present

## 2018-06-03 DIAGNOSIS — I10 Essential (primary) hypertension: Secondary | ICD-10-CM | POA: Insufficient documentation

## 2018-06-03 DIAGNOSIS — G4733 Obstructive sleep apnea (adult) (pediatric): Secondary | ICD-10-CM | POA: Diagnosis not present

## 2018-06-03 DIAGNOSIS — Z823 Family history of stroke: Secondary | ICD-10-CM | POA: Diagnosis not present

## 2018-06-03 DIAGNOSIS — I480 Paroxysmal atrial fibrillation: Secondary | ICD-10-CM | POA: Insufficient documentation

## 2018-06-03 DIAGNOSIS — I2510 Atherosclerotic heart disease of native coronary artery without angina pectoris: Secondary | ICD-10-CM | POA: Diagnosis not present

## 2018-06-03 DIAGNOSIS — R55 Syncope and collapse: Secondary | ICD-10-CM | POA: Diagnosis not present

## 2018-06-03 DIAGNOSIS — Z7901 Long term (current) use of anticoagulants: Secondary | ICD-10-CM | POA: Insufficient documentation

## 2018-06-03 DIAGNOSIS — I272 Pulmonary hypertension, unspecified: Secondary | ICD-10-CM | POA: Diagnosis not present

## 2018-06-03 DIAGNOSIS — Z79899 Other long term (current) drug therapy: Secondary | ICD-10-CM | POA: Insufficient documentation

## 2018-06-03 DIAGNOSIS — E785 Hyperlipidemia, unspecified: Secondary | ICD-10-CM | POA: Insufficient documentation

## 2018-06-03 DIAGNOSIS — N400 Enlarged prostate without lower urinary tract symptoms: Secondary | ICD-10-CM | POA: Insufficient documentation

## 2018-06-03 MED ORDER — NIFEDIPINE ER OSMOTIC RELEASE 60 MG PO TB24: 120.0000 mg | ORAL_TABLET | Freq: Every day | ORAL | 3 refills | Status: DC

## 2018-06-03 NOTE — Patient Instructions (Signed)
INCREASE Procardia to 120 mg (2 Tablets) Daily.  Follow up as scheduled.

## 2018-06-03 NOTE — Progress Notes (Signed)
PCP: Josetta Huddle, MD Cardiology: Dr. Aundra Dubin EP: Dr. Caryl Comes  72 y.o. with history of nonobstructive CAD and paroxysmal atrial fibrillation presents for evaluation of pulmonary hypertension.  Patient had ablations for atrial fibrillation in 2009 and 2010, he has been primarily in NSR since that time. He has OSA and uses CPAP regularly.  He had a cath in 2007 with nonobstructive CAD and a Cardiolite in 2017 without ischemia.  He has not had a recent echo in [place].    For several months, he had been noting increased fatigue with exercise.  In 5/19, he was in [place], [place].  He was on a treadmill doing his usual routine when he began to feel "funny."  He had an odd feeling in his abdomen.  He lost consciousness and fell to the ground.  He was not unconscious for very long.  He was taken to the [facility]  While there, he had a very extensive workup.  Echo showed pulmonary hypertension with signs of RV failure.  V/Q scan showed no chronic PE and CT chest did not show significant abnormalities.  RHC was done, this showed pulmonary hypertension that appeared vasoreactive with NO testing (see PMH section below for numbers).  He was started on nifedipine XR.  This has been titrated up to 90 mg daily.  He had an echo again in 7/19 at the [facility], this showed moderate RV dilation, borderline decreased RV function.  RVSP was down to 54 mmHg from 74 mmHg.   He has generally been doing well.  He has tolerated the increase in nifedipine XR, has had mild ankle edema at the end of the day.  He continues to exercise most days, uses treadmill and stationary bike. His breathing has been better since starting nifedipine XR.  No dyspnea walking up inclines now.  No syncope/lightheadedness.  No BRBPR/melena. Rare lightheadedness with rapid standing. He has occasional random "spells" where he will breathe heavily without trigger.  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   Labs (4/19): K  4.9, creatinine 1.02, LDL 62  6 minute walk (5/19): 616 m [facility]) 6 minute walk (7/19): 693 m [facility])  ECG (4/19, personally reviewed): NSR,1st degree AVB  PMH: 1. Atrial fibrillation: Paroxysmal.  S/p ablation in 2009, then repeat ablation in 2010 at [facility].   2. H/o TIA in 1990s 3. GERD 4. HTN 5. Hyperlipidemia 6. BPH 7. OSA: Uses CPAP 8. CAD: LHC in 2007 with nonobstructive disease.  - Cardiolite in 2017 did not show ischemia or infarction.  9. Pulmonary hypertension:  Diagnosed after syncopal episode on treadmill in 5/19.  - V/Q scan (5/19): No evidence for chronic PE - ANA, TSH, HIV negative (5/19) - CT chest (5/19): No evidence for ILD.  - PFTs (5/19): Mild obstruction.  - Echo (5/19): EF 65-70%, IV septal flattening with moderately enlarged RV with moderately decreased systolic function. PASP 74 mmHg.  - RHC (5/19): Initial RHC with mean RA 4, RV 62/7, PA 68/19 mean 40, PCWP 22, CI 2.45, PVR 3 WU => primarily pulmonary venous hypertension with no evidence for shunt lesion (PA sat 70%, RA sat 69%).  He was then given NTG sprays and RHC was repeated, showing fall in PCWP out of proportion to fall in PCWP and rise in PVR to 3.46 => unmasked PAH.  Next, he was started on inhaled NO and numbers were repeated: mean PA 19, PCWP 9, CI 2.7, PVR 1.5 => correction with NO, so vasoreactive.  -  Start on nifedipine CR as vasoreactive.  - Echo (7/19): EF 58%, moderate RV dilation with borderline decreased systolic function, RVSP 54 mmHg.   SH: Married, lives in [place], 2 daughters, [facility], never smoked.   Family History  Problem Relation Age of Onset  . Heart attack Father   . Coronary artery disease Father   . Transient ischemic attack Mother   . Coronary artery disease Mother        h/o TIA   . Stroke Mother   . Colon cancer Other        aunt and uncle   ROS: All systems reviewed and negative except as per HPI.   Current Outpatient  Medications  Medication Sig Dispense Refill  . albuterol (PROVENTIL HFA;VENTOLIN HFA) 108 (90 Base) MCG/ACT inhaler Inhale 2 puffs into the lungs every 6 (six) hours as needed for wheezing or shortness of breath. 1 Inhaler 6  . alfuzosin (UROXATRAL) 10 MG 24 hr tablet Take 10 mg by mouth daily.      Marland Kitchen apixaban (ELIQUIS) 5 MG TABS tablet Take 1 tablet (5 mg total) by mouth 2 (two) times daily. 60 tablet 12  . atorvastatin (LIPITOR) 10 MG tablet Take 10 mg by mouth daily.    Marland Kitchen b-complex w/C (TOTAL B/C) TABS tablet Take 1 tablet by mouth daily.    . calcium-vitamin D (OSCAL WITH D) 500-200 MG-UNIT per tablet 2 tabs po am and 1 tab po pm     . cloNIDine (CATAPRES) 0.1 MG tablet TAKE 1/2 TAB EVERY 2 HOURS FOR SBP 160 TO 180, AND 1 TAB EVERY 2 HOURS FOR SBP 180; as needed    . co-enzyme Q-10 30 MG capsule Take 100 mg by mouth 4 (four) times daily as needed.     Marland Kitchen FIBER PO Take 5-6 capsules by mouth daily.    . finasteride (PROSCAR) 5 MG tablet Take 5 mg by mouth daily.      . metoprolol succinate (TOPROL-XL) 50 MG 24 hr tablet Take 50 mg by mouth daily. Take with or immediately following a meal.    . NIFEdipine (PROCARDIA XL/ADALAT-CC) 60 MG 24 hr tablet Take 1 tablet (60 mg total) by mouth daily. 90 tablet 3  . Probiotic Product (PROBIOTIC DAILY PO) Take 10 mg by mouth daily.    . ramipril (ALTACE) 10 MG capsule Take 2 capsules (20 mg total) by mouth daily. 180 capsule 3  . Tiotropium Bromide Monohydrate (SPIRIVA RESPIMAT) 2.5 MCG/ACT AERS Inhale 2 puffs into the lungs daily. 1 Inhaler 3   No current facility-administered medications for this encounter.    BP (!) 142/70   Pulse 77   Wt 98.8 kg (217 lb 12.8 oz)   SpO2 96%   BMI 25.83 kg/m  General: NAD Neck: No JVD, no thyromegaly or thyroid nodule.  Lungs: Clear to auscultation bilaterally with normal respiratory effort. CV: Nondisplaced PMI.  Heart regular S1/S2, no S3/S4, no murmur.  No peripheral edema.  No carotid bruit.  Normal pedal  pulses.  Abdomen: Soft, nontender, no hepatosplenomegaly, no distention.  Skin: Intact without lesions or rashes.  Neurologic: Alert and oriented x 3.  Psych: Normal affect. Extremities: No clubbing or cyanosis.  HEENT: Normal.   Assessment/Plan: 1. Pulmonary hypertension: This may be the cause of his syncopal episode in 5/19. Echo in 5/19 showed elevated PA pressure with RV dysfunction.  V/Q scan and CT chest were negative.  Serologic workup was negative. Initial RHC looked like pulmonary venous hypertension  but NTG spray was able to unmask PAH.  NO challenge in the cath lab led to a significant response with elimination of PH and fall in PVR to 1.5 WU. Therefore, patient was thought to have vasoreactive PH and was started on nifedipine XR 30 mg daily and titrated up.  This type of PH is thought to have better prognosis.  Goal will be to gradually increase nifedipine XR dose and to follow clinical symptoms, echo, and BNP. If PH/RV function do not improve as desired with nifedipine XR, selective pulmonary vasodilators remain an option down the road. He has OSA but this is treated with CPAP. OSA does not explain the extent of his PH.  NYHA class I-II currently, not volume overloaded on exam.  Excellent 6 minute walk in 7/19 with improvement in PA pressure on 7/19 repeat echo.  - Increase nifedipine XR to 120 mg daily today.  BP remains normal to elevated.  - He is going back to [facility] in [place] in 2/19, will have an echo and 6 minute walk at that time.  I should be able to see the test results on Care Everywhere.  2. Syncope: It is certainly possible that his prior episode of exertional syncope was due to untreated pulmonary hypertension.  However, cannot fully rule out arrhythmia.  - He has worn a holter monitor but results have not yet been scanned in. I will try to track down the study.  3. Atrial fibrillation: Paroxysmal.  He is in NSR by exam today.  - Continue Eliquis.  - Continue  Toprol XL.  4. HTN: BP running on the high side, we will be increasing nifedipine XR today.  5. OSA: Using CPAP.  6. CAD: Nonobstructive on 2007 cath.  He is on atorvastatin. Good lipids in 4/19.  No ASA given apixaban use.   Followup in 3 months.   Loralie Champagne 06/03/2018

## 2018-06-06 ENCOUNTER — Telehealth: Payer: Self-pay | Admitting: Internal Medicine

## 2018-06-06 NOTE — Telephone Encounter (Signed)
Received paperwork from upfront. [name] that I have placed paperwork in MR's cubby. Will route to [facility] and MR for followup.

## 2018-06-06 NOTE — Telephone Encounter (Signed)
Patient is scheduled for a follow up visit with MR Monday, 9/16. Will give MR the paperwork at that visit.

## 2018-06-09 ENCOUNTER — Ambulatory Visit (INDEPENDENT_AMBULATORY_CARE_PROVIDER_SITE_OTHER): Payer: BC Managed Care – PPO | Admitting: Internal Medicine

## 2018-06-09 ENCOUNTER — Encounter: Payer: Self-pay | Admitting: Internal Medicine

## 2018-06-09 VITALS — BP 114/64 | HR 79 | Ht 77.0 in | Wt 218.2 lb

## 2018-06-09 DIAGNOSIS — J449 Chronic obstructive pulmonary disease, unspecified: Secondary | ICD-10-CM | POA: Diagnosis not present

## 2018-06-09 NOTE — Patient Instructions (Addendum)
ICD-10-CM   1. Chronic obstructive pulmonary disease, unspecified COPD type ([place]) J44.9    I do not think you have emphysema variety of obstructive lung disease and can explain why empiric spiriva did not work  Very mild cough could be aggravated by ramipril but agree we will keep an eye on it  Glad you had flu shot already  Plan - stop spiriva - please call/text me before 06/21/18 trip to see how you are doing off spiriva - if needed we can try inhaled steroid next time  Followup -early to mid November for followup before trip to [place]

## 2018-06-09 NOTE — Progress Notes (Signed)
PCP Josetta Huddle, MD   HPI  IOV 04/14/2018  Chief Complaint  Patient presents with  . Pulmonary Consult    Dr. 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 He presents at the request of his friend Dr Coralie Keens retired cardiologist. He has been recently diagnosed with COPD and pulmonary venous and arterial hypertension at [name] in [place]. His primary residence s in [place], [place]. His son-in-law's a nephrologist at [facility] and therefore he frequents [place]. He was advised that he needs a local pulmonologist and cardiologist. He has recently registered with Dr. Loralie Champagne in cardiology for his pulmonary hypertension.history is gained by talking to him and review of the [place] records.  Just before Memorial Day 2019 he had a syncopal episode on the treadmill at 4.8 miles per hour and had laceration . Workup at this time revealed pulmonary venous hypertension and pulmonary arterial hypertension which was calcium channel blocker responsive. All the details in [place] records and Dr. Aundra Dubin records. He has been started on niffedipine which has been increased by Dr. Aundra Dubin on office visit 03/06/2018 here in [place]. Since that time he overall is feeling better. He is currently working out on the treadmill at 4.2 miles an hourand he thinks he is better although he is not challenge himself at 4.8 miles an hour. Review of the records indicate that he does have sleep apnea for which he takes CPAP? Followed by Dr. Inda Merlin in [place].    Pulmonary function test 02/19/2018 at the [facility] shows moderate obstructive lung disease without bronchodilator response and normal DLCO. His FEV1 is 2.84 L/69% and a ratio 55. DLCO is normal at 92. His total lung capacity and residual volumes show  hyperinflation with air trapping at 119% and 192% respectively.he tells me approximately 10 years ago he had pulmonary function test while on amiodarone and this did show "some amount of COPD". Because he was feeling well otherwise not much attentionto this. We do not have those test results. He's had a VQ scan that did not show any evidence of chronic PE. His HIV and ANA were negative. He had a CT chest at [facility] that I review the results and for normal parenchyma. He had a CT chest here in 2007. Lung images were visualized by myself and it id not show any evidence of emphysema  He has a follow-up coming up at [facility] with cardiology it is been referred to [facility] pulmonary for October 2019. Currently he has some amount of symptoms with some mild cough that he thinks is because of postnasal drip although he is on an ACE inhibitor. His current symptomatology some amount of cough and shortness of breath with the COPD cat score of 13. He does have postnasal drip. He occasionally has a grossly noise in the night but denies any wheezing. He has not been on any inhaler therapy     OV 06/09/2018  Subjective:  [name], male , DOB: 12-18-1945 , age 71 y.o. , MRN: [id number] , ADDRESS: [address] [place] [facility] [address]   06/09/2018 -   Chief Complaint  Patient presents with  . Follow-up    Pt states he has been doing good since last visit. States he still has some SOB with exertion and  has an occ cough.     HPI [name] [name] 72 y.o. -presents for follow-up. In the interim he went to [facility] Review of the records show that repeat echocardiogram showed improvement in pulmonary artery pressures. His Procardia has been adjusted. 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 He does have obstructive lung disease without prior history of smoking or emphysema on the CT scan in 2007. We tried an prick Spiriva but  he is not so sure does help him. He does have some mild cough but he thinks it is because of postnasal drip. He does not think it is because of his ACE inhibitor. At this point in time he is open to giving himself a trial off Spiriva. He has some leg edema and he was wondering if it was because of Spiriva although his cardiologist has told him it might be because of his Procardia.he will ultimately retire at the end of November from [place] ultimately relocated to [place]. He has trips upcoming to the [facility] end of September 2019 and again in mid November 2019.    CAT Symptom & Quality of Life Score (GSK trademark) 0 is no burden. 5 is highest burden 04/14/2018  06/09/2018   Never Cough -> Cough all the time 2 2  No phlegm in chest -> Chest is full of phlegm 0 2  No chest tightness -> Chest feels very tight 1 2  No dyspnea for 1 flight stairs/hill -> Very dyspneic for 1 flight of stairs 2 2  No limitations for ADL at home -> Very limited with ADL at home 1 2  Confident leaving home -> Not at all confident leaving home 1 2  Sleep soundly -> Do not sleep soundly because of lung condition 2 2  Lots of Energy -> No energy at all 3 3  TOTAL Score (max 40)  13 17    ROS - per HPI     has a past medical history of Atrial fibrillation ([place]), CAD (coronary artery disease), Cancer ([place]), Cardiac arrhythmia due to congenital heart disease, Cataract, nonsenile, COPD (chronic obstructive pulmonary disease) ([place]), DJD (degenerative joint disease), Finger, superficial foreign body (splinter), without major open wound, infected, GERD (gastroesophageal reflux disease), HTN (hypertension), Leukopenia, Osteoporosis, Sleep apnea, and TIA (transient ischemic attack) (1990s).   reports that he has never smoked. He has never used smokeless tobacco.  Past Surgical History:  Procedure Laterality Date  . CATARACT EXTRACTION    . RF ablation for A. Fib '09 - Dr. Rolland Porter, [place]. x 2    . TONSILLECTOMY       No Known Allergies  Immunization History  Administered Date(s) Administered  . Influenza Split 07/01/2012  . Influenza Whole 06/14/2008, 06/27/2010  . Influenza, High Dose Seasonal PF 06/04/2018  . Influenza,inj,Quad PF,6+ Mos 06/17/2013, 05/01/2016  . Pneumococcal Conjugate-13 11/09/2013, 04/22/2014  . Pneumococcal Polysaccharide-23 06/27/2010  . Td 10/17/2007  . Tdap 07/01/2012  . Zoster 04/09/2006    Family History  Problem Relation Age of Onset  . Heart attack Father   . Coronary artery disease Father   . Transient ischemic attack Mother   . Coronary artery disease Mother        h/o TIA   . Stroke Mother   . Colon cancer Other        aunt and uncle     Current Outpatient Medications:  .  albuterol (PROVENTIL HFA;VENTOLIN HFA) 108 (90 Base)  MCG/ACT inhaler, Inhale 2 puffs into the lungs every 6 (six) hours as needed for wheezing or shortness of breath., Disp: 1 Inhaler, Rfl: 6 .  alfuzosin (UROXATRAL) 10 MG 24 hr tablet, Take 10 mg by mouth daily.  , Disp: , Rfl:  .  apixaban (ELIQUIS) 5 MG TABS tablet, Take 1 tablet (5 mg total) by mouth 2 (two) times daily., Disp: 60 tablet, Rfl: 12 .  atorvastatin (LIPITOR) 10 MG tablet, Take 10 mg by mouth daily., Disp: , Rfl:  .  b-complex w/C (TOTAL B/C) TABS tablet, Take 1 tablet by mouth daily., Disp: , Rfl:  .  calcium-vitamin D (OSCAL WITH D) 500-200 MG-UNIT per tablet, 2 tabs po am and 1 tab po pm , Disp: , Rfl:  .  co-enzyme Q-10 30 MG capsule, Take 100 mg by mouth 4 (four) times daily as needed. , Disp: , Rfl:  .  FIBER PO, Take 5-6 capsules by mouth daily., Disp: , Rfl:  .  finasteride (PROSCAR) 5 MG tablet, Take 5 mg by mouth daily.  , Disp: , Rfl:  .  metoprolol succinate (TOPROL-XL) 50 MG 24 hr tablet, Take 50 mg by mouth daily. Take with or immediately following a meal., Disp: , Rfl:  .  NIFEdipine (PROCARDIA XL/ADALAT-CC) 60 MG 24 hr tablet, Take 2 tablets (120 mg total) by mouth daily., Disp: 180 tablet, Rfl: 3 .   Probiotic Product (PROBIOTIC DAILY PO), Take 10 mg by mouth daily., Disp: , Rfl:  .  ramipril (ALTACE) 10 MG capsule, Take 2 capsules (20 mg total) by mouth daily., Disp: 180 capsule, Rfl: 3      Objective:   Vitals:   06/09/18 1109  BP: 114/64  Pulse: 79  SpO2: 97%  Weight: 218 lb 3.2 oz (99 kg)  Height: 6\' 5"  (1.956 m)    Estimated body mass index is 25.87 kg/m as calculated from the following:   Height as of this encounter: 6\' 5"  (1.956 m).   Weight as of this encounter: 218 lb 3.2 oz (99 kg).  @WEIGHTCHANGE @  [employer]   06/09/18 1109  Weight: 218 lb 3.2 oz (99 kg)     Physical Exam  General Appearance:    Alert, cooperative, no distress, appears stated age - yes , sitting on - chair  Head:    Normocephalic, without obvious abnormality, atraumatic  Eyes:    PERRL, conjunctiva/corneas clear,  Ears:    Normal TM's and external ear canals, both ears  Nose:   Nares normal, septum midline, mucosa normal, no drainage    or sinus tenderness. OXYGEN ON  - no . Patient is @ ra   Throat:   Lips, mucosa, and tongue normal; teeth and gums normal. Cyanosis on lips - no  Neck:   Supple, symmetrical, trachea midline, no adenopathy;    thyroid:  no enlargement/tenderness/nodules; no carotid   bruit or JVD  Back:     Symmetric, no curvature, ROM normal, no CVA tenderness  Lungs:     Distress - no , Wheeze no, Barrell Chest - no, Purse lip breathing - no, Crackles - no   Chest Wall:    No tenderness or deformity. Scars in chest no   Heart:    Regular rate and rhythm, S1 and S2 normal, no murmur, rub   or gallop  Breast Exam:    NOT DONE  Abdomen:     Soft, non-tender, bowel sounds active all four quadrants,    no masses, no organomegaly  Genitalia:   NOT DONE  Rectal:   NOT DONE  Extremities:   Extremities normal, atraumatic, Clubbing - no, Edema - no  Pulses:   2+ and symmetric all extremities  Skin:   Stigmata of Connective Tissue Disease - no  Lymph nodes:   Cervical,  supraclavicular, and axillary nodes normal  Psychiatric:  Neurologic:   pleasant CNII-XII intact, normal strength, sensation  throughout           Assessment:       ICD-10-CM   1. Chronic obstructive pulmonary disease, unspecified COPD type ([place]) J44.9        Plan:      I do not think you have emphysema variety of obstructive lung disease and can explain why empiric spiriva did not work  Very mild cough could be aggravated by ramipril but agree we will keep an eye on it  Glad you had flu shot already  Plan - stop spiriva - please call/text me before 06/21/18 trip to see how you are doing off spiriva - if needed we can try inhaled steroid next time  Followup -early to mid November for followup before trip to [place]   (> 50% of this 15 min visit spent in face to face counseling or/and coordination of care by this undersigned MD - Dr Brand Males. This includes one or more of the following documented above: discussion of test results, diagnostic or treatment recommendations, prognosis, risks and benefits of management options, instructions, education, compliance or risk-factor reduction)    SIGNATURE    Dr. Brand Males, M.D., F.C.C.P,  Pulmonary and Critical Care Medicine Staff Physician, [place] Director - Interstitial Lung Disease  Program  Pulmonary [place] at [place], [place], [address]  Pager: [phone], If no answer or between  15:00h - 7:00h: call [phone] Telephone: [phone]  11:42 AM 06/09/2018

## 2018-06-10 ENCOUNTER — Telehealth (HOSPITAL_COMMUNITY): Payer: Self-pay | Admitting: *Deleted

## 2018-06-10 NOTE — Telephone Encounter (Signed)
Result Notes for HOLTER MONITOR - 48 HOUR   Notes recorded by Darron Doom, RN on 06/10/2018 at 8:48 AM EDT Called and left detailed message. Report faxed today to [facility] in [place] @ [phone]. ------  Notes recorded by Larey Dresser, MD on 06/08/2018 at 8:16 PM EDT Nocturnal pauses noted on monitor, up to 2.8 seconds. These were all early in am and likely related to sleep-disordered breathing. Would not explain his syncopal event. Please send copy of monitor to his cardiologist at [facility] in [place].

## 2018-06-10 NOTE — Telephone Encounter (Signed)
Forms were given to MR at pt's visit 06/09/18. Nothing further was needed.

## 2018-07-29 ENCOUNTER — Ambulatory Visit (INDEPENDENT_AMBULATORY_CARE_PROVIDER_SITE_OTHER): Payer: BC Managed Care – PPO | Admitting: Internal Medicine

## 2018-07-29 ENCOUNTER — Encounter: Payer: Self-pay | Admitting: Internal Medicine

## 2018-07-29 VITALS — BP 130/88 | HR 66 | Ht 78.0 in | Wt 210.0 lb

## 2018-07-29 DIAGNOSIS — R0609 Other forms of dyspnea: Secondary | ICD-10-CM

## 2018-07-29 NOTE — Progress Notes (Signed)
HPI  IOV 04/14/2018  Chief Complaint  Patient presents with  . Pulmonary Consult    Dr. 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 He presents at the request of his friend Dr Coralie Keens retired cardiologist. He has been recently diagnosed with COPD and pulmonary venous and arterial hypertension at [facility] in [place]. His primary residence s in [place], [place]. His son-in-law's a nephrologist at [facility] and therefore he frequents [place]. He was advised that he needs a local pulmonologist and cardiologist. He has recently registered with Dr. Loralie Champagne in cardiology for his pulmonary hypertension.history is gained by talking to him and review of the [place] records.  Just before Memorial Day 2019 he had a syncopal episode on the treadmill at 4.8 miles per hour and had laceration . Workup at this time revealed pulmonary venous hypertension and pulmonary arterial hypertension which was calcium channel blocker responsive. All the details in [place] records and Dr. Aundra Dubin records. He has been started on niffedipine which has been increased by Dr. Aundra Dubin on office visit 03/06/2018 here in [place]. Since that time he overall is feeling better. He is currently working out on the treadmill at 4.2 miles an hourand he thinks he is better although he is not challenge himself at 4.8 miles an hour. Review of the records indicate that he does have sleep apnea for which he takes CPAP? Followed by Dr. Inda Merlin in [place].    Pulmonary function test 02/19/2018 at the [facility] shows moderate obstructive lung disease without bronchodilator response and normal DLCO. His FEV1 is 2.84 L/69% and a ratio 55. DLCO is normal at 92. His total lung capacity and residual volumes show hyperinflation with air trapping  at 119% and 192% respectively.he tells me approximately 10 years ago he had pulmonary function test while on amiodarone and this did show "some amount of COPD". Because he was feeling well otherwise not much attentionto this. We do not have those test results. He's had a VQ scan that did not show any evidence of chronic PE. His HIV and ANA were negative. He had a CT chest at [facility] that I review the results and for normal parenchyma. He had a CT chest here in 2007. Lung images were visualized by myself and it id not show any evidence of emphysema  He has a follow-up coming up at [facility] with cardiology it is been referred to [facility] pulmonary for October 2019. Currently he has some amount of symptoms with some mild cough that he thinks is because of postnasal drip although he is on an ACE inhibitor. His current symptomatology some amount of cough and shortness of breath with the COPD cat score of 13. He does have postnasal drip. He occasionally has a grossly noise in the night but denies any wheezing. He has not been on any inhaler therapy     OV 06/09/2018  Subjective:  [name], male , DOB: Nov 09, 1945 , age 30 y.o. , MRN: [id number] , ADDRESS: [address] [place] [facility] [address]   06/09/2018 -   Chief Complaint  Patient presents with  . Follow-up    Pt states he has been doing good since last visit. States he still has some SOB with exertion and has an occ cough.  HPI [name] [name] 72 y.o. -presents for follow-up. In the interim he went to [facility] Review of the records show that repeat echocardiogram showed improvement in pulmonary artery pressures. His Procardia has been adjusted. 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 He does have obstructive lung disease without prior history of smoking or emphysema on the CT scan in 2007. We tried empitic Spiriva but he is not so sure does help him.  He does have some mild cough but he thinks it is because of postnasal drip. He does not think it is because of his ACE inhibitor. At this point in time he is open to giving himself a trial off Spiriva. He has some leg edema and he was wondering if it was because of Spiriva although his cardiologist has told him it might be because of his Procardia.he will ultimately retire at the end of November from [place] ultimately relocated to [place]. He has trips upcoming to the [facility] end of September 2019 and again in mid November 2019.  ROS  OV 07/29/2018  Subjective:  [name], male , DOB: 1946-07-07 , age 73 y.o. , MRN: [id number] , ADDRESS: [address] [place] [facility] [address]   07/29/2018 -   Chief Complaint  Patient presents with  . Follow-up    Doing well breathing is getting better.     HPI [name] [name] 72 y.o. -returns for follow-up of dyspnea in the setting of nonspecific of structural lung disease and Group 1 pulmonary hypertension.  Since his last visit he has been to the [facility] in [place].  They have given him a definitive diagnosis of Group 1 pulmonary arterial hypertension.  The increase his nifedipine to 90 mg daily.  He was reaching adequate blood pressure control and therefore he stopped his ramipril in the last few weeks.  With this his cough is improved significantly.  He has very mild cough at this point.  Also his dyspnea significantly improved and is able to do more exercise on the treadmill.  He never found any benefit with inhalers especially Spiriva.  He feels he does not need any inhalers.  He does not use his albuterol.  However he is retiring at the end of the month from [employer] and is going to relocate to the [facility].  Therefore he said he will take an inhaler of steroid just in case.  He is up-to-date with his vaccines.  He is having some prostate problems and is going to see Dr. Diona Fanti his urologist.   [facility]  records reviewed  CAT Symptom & Quality of Life Score ([place] trademark) 0 is no burden. 5 is highest burden 04/14/2018  06/09/2018  07/29/2018 With increased nifedipine.  No Spiriva Of ramipril  Never Cough -> Cough all the time 2 2 2   No phlegm in chest -> Chest is full of phlegm 0 2 1  No chest tightness -> Chest feels very tight 1 2 1   No dyspnea for 1 flight stairs/hill -> Very dyspneic for 1 flight of stairs 2 2 1   No limitations for ADL at home -> Very limited with ADL at home 1 2 1   Confident leaving home -> Not at all confident leaving home 1 2 1   Sleep soundly -> Do not sleep soundly because of lung condition 2 2 1   Lots of Energy -> No energy at all 3 3 2   TOTAL  Score (max 40)  13 17 10       ROS - per HPI     has a past medical history of Atrial fibrillation ([place]), CAD (coronary artery disease), Cancer ([place]), 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   reports that he has never smoked. He has never used smokeless tobacco.  Past Surgical History:  Procedure Laterality Date  . CATARACT EXTRACTION    . RF ablation for A. Fib '09 - Dr. Rolland Porter, [place]. x 2    . TONSILLECTOMY      No Known Allergies  Immunization History  Administered Date(s) Administered  . Influenza Split 07/01/2012  . Influenza Whole 06/14/2008, 06/27/2010  . Influenza, High Dose Seasonal PF 06/04/2018  . Influenza,inj,Quad PF,6+ Mos 06/17/2013, 05/01/2016  . Pneumococcal Conjugate-13 11/09/2013, 04/22/2014  . Pneumococcal Polysaccharide-23 06/27/2010  . Td 10/17/2007  . Tdap 07/01/2012  . Zoster 04/09/2006    Family History  Problem Relation Age of Onset  .  Heart attack Father   . Coronary artery disease Father   . Transient ischemic attack Mother   . Coronary artery disease Mother        h/o TIA   . Stroke Mother   . Colon cancer Other        aunt and uncle     Current Outpatient Medications:  .  albuterol (PROVENTIL HFA;VENTOLIN HFA) 108 (90 Base) MCG/ACT inhaler, Inhale 2 puffs into the lungs every 6 (six) hours as needed for wheezing or shortness of breath., Disp: 1 Inhaler, Rfl: 6 .  apixaban (ELIQUIS) 5 MG TABS tablet, Take 1 tablet (5 mg total) by mouth 2 (two) times daily., Disp: 60 tablet, Rfl: 12 .  atorvastatin (LIPITOR) 10 MG tablet, Take 10 mg by mouth daily., Disp: , Rfl:  .  b-complex w/C (TOTAL B/C) TABS tablet, Take 1 tablet by mouth daily., Disp: , Rfl:  .  calcium-vitamin D (OSCAL WITH D) 500-200 MG-UNIT per tablet, 2 tabs po am and 1 tab po pm , Disp: , Rfl:  .  co-enzyme Q-10 30 MG capsule, Take 100 mg by mouth 4 (four) times daily as needed. , Disp: , Rfl:  .  FIBER PO, Take 5-6 capsules by mouth daily., Disp: , Rfl:  .  finasteride (PROSCAR) 5 MG tablet, Take 5 mg by mouth daily.  , Disp: , Rfl:  .  metoprolol succinate (TOPROL-XL) 50 MG 24 hr tablet, Take 50 mg by mouth daily. Take with or immediately following a meal., Disp: , Rfl:  .  NIFEdipine (PROCARDIA XL/ADALAT-CC) 60 MG 24 hr tablet, Take 2 tablets (120 mg total) by mouth daily., Disp: 180 tablet, Rfl: 3 .  Probiotic Product (PROBIOTIC DAILY PO), Take 10 mg by mouth daily., Disp: , Rfl:  .  ramipril (ALTACE) 10 MG capsule, Take 2 capsules (20 mg total) by mouth daily., Disp: 180 capsule, Rfl: 3 .  tamsulosin (FLOMAX) 0.4 MG CAPS capsule, TK 1 C PO QD, Disp: , Rfl: 11      Objective:   Vitals:   07/29/18 1041  BP: 130/88  Pulse: 66  SpO2: 97%  Weight: 210 lb (95.3 kg)  Height: 6\' 6"  (1.981 m)    Estimated body mass index is 24.27 kg/m as calculated from the following:   Height  as of this encounter: 6\' 6"  (1.981 m).   Weight as of this encounter:  210 lb (95.3 kg).  @WEIGHTCHANGE @  [employer]   07/29/18 1041  Weight: 210 lb (95.3 kg)     Physical Exam  General Appearance:    Alert, cooperative, no distress, appears stated age - yes , Deconditioned looking - no , OBESE  - no but is tall, Sitting on Wheelchair -  no  Head:    Normocephalic, without obvious abnormality, atraumatic  Eyes:    PERRL, conjunctiva/corneas clear,  Ears:    Normal TM's and external ear canals, both ears  Nose:   Nares normal, septum midline, mucosa normal, no drainage    or sinus tenderness. OXYGEN ON  - no . Patient is @ ra   Throat:   Lips, mucosa, and tongue normal; teeth and gums normal. Cyanosis on lips - no  Neck:   Supple, symmetrical, trachea midline, no adenopathy;    thyroid:  no enlargement/tenderness/nodules; no carotid   bruit or JVD  Back:     Symmetric, no curvature, ROM normal, no CVA tenderness  Lungs:     Distress - no , Wheeze no, Barrell Chest - no, Purse lip breathing - no, Crackles - no   Chest Wall:    No tenderness or deformity.    Heart:    Regular rate and rhythm, S1 and S2 normal, no rub   or gallop, Murmur - no  Breast Exam:    NOT DONE  Abdomen:     Soft, non-tender, bowel sounds active all four quadrants,    no masses, no organomegaly. Visceral obesity - no  Genitalia:   NOT DONE  Rectal:   NOT DONE  Extremities:   Extremities - normal, Has Cane - no, Clubbing - no, Edema - no  Pulses:   2+ and symmetric all extremities  Skin:   Stigmata of Connective Tissue Disease - no  Lymph nodes:   Cervical, supraclavicular, and axillary nodes normal  Psychiatric:  Neurologic:   Pleasant - yes, Anxious - no, Flat affect - no  CAm-ICU - neg, Alert and Oriented x 3 - yes, Moves all 4s - yes, Speech - normal, Cognition - intact           Assessment:       ICD-10-CM   1. Dyspnea on exertion R06.09        Plan:     Patient Instructions     ICD-10-CM   1. Dyspnea on exertion R06.09    Shortness of breath is  improved with just pulmonary hypertension control.   PLAN Okay to stop ramipril if nifedipine itself is controlling the blood pressure  -Plus you can see if cough is gone up fully improve without ramipril  At this point he can just monitor your symptoms and just do pulmonary hypertension treatment and exercise  Evaluate shortness of breath of cough got worse you could try a course of inhaled steroids for 2-4 weeks and see if there is an improvement  -You can take a sample inhaler of Pulmicort 180 mcg 2 puffs 2 times daily   -My office can give a sample  Follow-up -As needed given your relocation to [facility]    Dr. Brand Males, M.D., F.C.C.P,  Pulmonary and Critical Care Medicine Staff Physician, [place] Director - Interstitial Lung Disease  Program  Pulmonary [place] at [facility],  [place], [address]  Pager: [phone], If no answer or between  15:00h - 7:00h: call [phone] Telephone: [phone]  11:11 AM 07/29/2018

## 2018-07-29 NOTE — Patient Instructions (Signed)
ICD-10-CM   1. Dyspnea on exertion R06.09    Shortness of breath is improved with just pulmonary hypertension control.   PLAN Okay to stop ramipril if nifedipine itself is controlling the blood pressure  -Plus you can see if cough is gone up fully improve without ramipril  At this point he can just monitor your symptoms and just do pulmonary hypertension treatment and exercise  Evaluate shortness of breath of cough got worse you could try a course of inhaled steroids for 2-4 weeks and see if there is an improvement  -You can take a sample inhaler of Pulmicort 180 mcg 2 puffs 2 times daily   -My office can give a sample  Follow-up -As needed given your relocation to [facility]

## 2018-08-11 ENCOUNTER — Telehealth (HOSPITAL_COMMUNITY): Payer: Self-pay

## 2018-08-11 NOTE — Telephone Encounter (Signed)
Surgical clearance faxed to [facility] on 08/08/2018

## 2018-08-26 ENCOUNTER — Ambulatory Visit (HOSPITAL_COMMUNITY)
Admission: RE | Admit: 2018-08-26 | Discharge: 2018-08-26 | Disposition: A | Discharge status: Home / Self Care | Payer: 59 | Source: Ambulatory Visit | Attending: Cardiology | Admitting: Cardiology

## 2018-08-26 ENCOUNTER — Encounter (HOSPITAL_COMMUNITY): Payer: Self-pay | Admitting: Cardiology

## 2018-08-26 VITALS — BP 120/72 | HR 74 | Wt 204.8 lb

## 2018-08-26 DIAGNOSIS — Z79899 Other long term (current) drug therapy: Secondary | ICD-10-CM | POA: Insufficient documentation

## 2018-08-26 DIAGNOSIS — Z7901 Long term (current) use of anticoagulants: Secondary | ICD-10-CM | POA: Insufficient documentation

## 2018-08-26 DIAGNOSIS — I480 Paroxysmal atrial fibrillation: Secondary | ICD-10-CM | POA: Insufficient documentation

## 2018-08-26 DIAGNOSIS — Z8249 Family history of ischemic heart disease and other diseases of the circulatory system: Secondary | ICD-10-CM | POA: Insufficient documentation

## 2018-08-26 DIAGNOSIS — I272 Pulmonary hypertension, unspecified: Secondary | ICD-10-CM | POA: Diagnosis not present

## 2018-08-26 DIAGNOSIS — E785 Hyperlipidemia, unspecified: Secondary | ICD-10-CM | POA: Insufficient documentation

## 2018-08-26 DIAGNOSIS — R55 Syncope and collapse: Secondary | ICD-10-CM | POA: Insufficient documentation

## 2018-08-26 DIAGNOSIS — I10 Essential (primary) hypertension: Secondary | ICD-10-CM | POA: Insufficient documentation

## 2018-08-26 DIAGNOSIS — I2510 Atherosclerotic heart disease of native coronary artery without angina pectoris: Secondary | ICD-10-CM | POA: Insufficient documentation

## 2018-08-26 DIAGNOSIS — G4733 Obstructive sleep apnea (adult) (pediatric): Secondary | ICD-10-CM | POA: Diagnosis not present

## 2018-08-26 DIAGNOSIS — Z9889 Other specified postprocedural states: Secondary | ICD-10-CM | POA: Insufficient documentation

## 2018-08-26 LAB — BRAIN NATRIURETIC PEPTIDE: B Natriuretic Peptide: 76 pg/mL (ref 0.0–100.0)

## 2018-08-26 LAB — BASIC METABOLIC PANEL
Anion gap: 9 (ref 5–15)
BUN: 11 mg/dL (ref 8–23)
CHLORIDE: 105 mmol/L (ref 98–111)
CO2: 23 mmol/L (ref 22–32)
CREATININE: 0.85 mg/dL (ref 0.61–1.24)
Calcium: 9.3 mg/dL (ref 8.9–10.3)
GFR calc Af Amer: >60 mL/min (ref >60)
GFR calc non Af Amer: >60 mL/min (ref >60)
Glucose, Bld: 107 mg/dL — ABNORMAL HIGH (ref 70–99)
Potassium: 4.3 mmol/L (ref 3.5–5.1)
SODIUM: 137 mmol/L (ref 135–145)

## 2018-08-26 MED ORDER — RAMIPRIL 5 MG PO CAPS: 5.0000 mg | ORAL_CAPSULE | Freq: Every day | ORAL | 3 refills | Status: DC

## 2018-08-26 NOTE — Progress Notes (Signed)
PCP: Josetta Huddle, MD Cardiology: Dr. Aundra Dubin EP: Dr. Caryl Comes  72 y.o. with history of nonobstructive CAD and paroxysmal atrial fibrillation returns for followup of pulmonary hypertension.  Patient had ablations for atrial fibrillation in 2009 and 2010, he has been primarily in NSR since that time. He has OSA and uses CPAP regularly.  He had a cath in 2007 with nonobstructive CAD and a Cardiolite in 2017 without ischemia.  He has not had a recent echo in [place].    For several months, he had been noting increased fatigue with exercise.  In 5/19, he was in [place], [place].  He was on a treadmill doing his usual routine when he began to feel "funny."  He had an odd feeling in his abdomen.  He lost consciousness and fell to the ground.  He was not unconscious for very long.  He was taken to the [facility].  While there, he had a very extensive workup.  Echo showed pulmonary hypertension with signs of RV failure.  V/Q scan showed no chronic PE and CT chest did not show significant abnormalities.  RHC was done, this showed pulmonary hypertension that appeared vasoreactive with NO testing (see PMH section below for numbers).  He was started on nifedipine XR.  This has been titrated up to 120 mg daily.  He had an echo again in 7/19 at the [facility], this showed moderate RV dilation, borderline decreased RV function.  RVSP was down to 54 mmHg from 74 mmHg.   He has been doing well recently.   No significant exertional dyspnea.  He exercises regularly.  No chest pain.  No lightheadedness.  He reports no significant limitations.  BP varies a lot, SBP ranges 90s-140s on his readings.  He take ramipril 10-20 mg when SBP is > 100.   Labs (4/19): K 4.9, creatinine 1.02, LDL 62  6 minute walk (5/19): 616 m [facility]) 6 minute walk (7/19): 693 m [facility]) 6 minute walk (11/19): 536 m  ECG (4/19, personally reviewed): NSR,1st degree AVB  PMH: 1. Atrial fibrillation: Paroxysmal.  S/p ablation  in 2009, then repeat ablation in 2010 at [facility].   2. H/o TIA in 1990s 3. GERD 4. HTN 5. Hyperlipidemia 6. BPH 7. OSA: Uses CPAP 8. CAD: LHC in 2007 with nonobstructive disease.  - Cardiolite in 2017 did not show ischemia or infarction.  9. Pulmonary hypertension:  Diagnosed after syncopal episode on treadmill in 5/19.  - V/Q scan (5/19): No evidence for chronic PE - ANA, TSH, HIV negative (5/19) - CT chest (5/19): No evidence for ILD.  - PFTs (5/19): Mild obstruction.  - Echo (5/19): EF 65-70%, IV septal flattening with moderately enlarged RV with moderately decreased systolic function. PASP 74 mmHg.  - RHC (5/19): Initial RHC with mean RA 4, RV 62/7, PA 68/19 mean 40, PCWP 22, CI 2.45, PVR 3 WU => primarily pulmonary venous hypertension with no evidence for shunt lesion (PA sat 70%, RA sat 69%).  He was then given NTG sprays and RHC was repeated, showing fall in PCWP out of proportion to fall in PCWP and rise in PVR to 3.46 => unmasked PAH.  Next, he was started on inhaled NO and numbers were repeated: mean PA 19, PCWP 9, CI 2.7, PVR 1.5 => correction with NO, so vasoreactive.  - Start on nifedipine CR as vasoreactive.  - Echo (7/19): EF 58%, moderate RV dilation with borderline decreased systolic function, RVSP 54 mmHg.  10. Holter (6/19) with  occasional up to 2.8 sec nocturnal pauses.   SH: Married, lives in [place], 2 daughters, [facility], never smoked.   Family History  Problem Relation Age of Onset  . Heart attack Father   . Coronary artery disease Father   . Transient ischemic attack Mother   . Coronary artery disease Mother        h/o TIA   . Stroke Mother   . Colon cancer Other        aunt and uncle   ROS: All systems reviewed and negative except as per HPI.   Current Outpatient Medications  Medication Sig Dispense Refill  . apixaban (ELIQUIS) 5 MG TABS tablet Take 1 tablet (5 mg total) by mouth 2 (two) times daily. 60 tablet 12  . atorvastatin  (LIPITOR) 10 MG tablet Take 10 mg by mouth daily.    Marland Kitchen b-complex w/C (TOTAL B/C) TABS tablet Take 1 tablet by mouth daily.    . calcium-vitamin D (OSCAL WITH D) 500-200 MG-UNIT per tablet 2 tabs po am and 1 tab po pm     . co-enzyme Q-10 30 MG capsule Take 100 mg by mouth 4 (four) times daily as needed.     . docusate sodium (COLACE) 100 MG capsule Take 100 mg by mouth daily.    Marland Kitchen FIBER PO Take 5-6 capsules by mouth daily.    . finasteride (PROSCAR) 5 MG tablet Take 5 mg by mouth daily.      . metoprolol succinate (TOPROL-XL) 50 MG 24 hr tablet Take 50 mg by mouth daily. Take with or immediately following a meal.    . NIFEdipine (PROCARDIA XL/ADALAT-CC) 60 MG 24 hr tablet Take 2 tablets (120 mg total) by mouth daily. 180 tablet 3  . Probiotic Product (PROBIOTIC DAILY PO) Take 10 mg by mouth daily.    . tamsulosin (FLOMAX) 0.4 MG CAPS capsule TK 1 C PO QD  11  . albuterol (PROVENTIL HFA;VENTOLIN HFA) 108 (90 Base) MCG/ACT inhaler Inhale 2 puffs into the lungs every 6 (six) hours as needed for wheezing or shortness of breath. (Patient not taking: Reported on 08/26/2018) 1 Inhaler 6  . ramipril (ALTACE) 5 MG capsule Take 1 capsule (5 mg total) by mouth daily. 90 capsule 3   No current facility-administered medications for this encounter.    BP 120/72   Pulse 74   Wt 92.9 kg (204 lb 12.8 oz)   SpO2 97%   BMI 23.67 kg/m  General: NAD Neck: No JVD, no thyromegaly or thyroid nodule.  Lungs: Clear to auscultation bilaterally with normal respiratory effort. CV: Nondisplaced PMI.  Heart regular S1/S2, no S3/S4, no murmur.  No peripheral edema.  No carotid bruit.  Normal pedal pulses.  Abdomen: Soft, nontender, no hepatosplenomegaly, no distention.  Skin: Intact without lesions or rashes.  Neurologic: Alert and oriented x 3.  Psych: Normal affect. Extremities: No clubbing or cyanosis.  HEENT: Normal.   Assessment/Plan: 1. Pulmonary hypertension: This may be the cause of his syncopal episode in  5/19. Echo in 5/19 showed elevated PA pressure with RV dysfunction.  V/Q scan and CT chest were negative.  Serologic workup was negative. Initial RHC looked like pulmonary venous hypertension but NTG spray was able to unmask PAH.  NO challenge in the cath lab led to a significant response with elimination of PH and fall in PVR to 1.5 WU. Therefore, patient was thought to have vasoreactive PH and was started on nifedipine XR 30 mg daily and titrated  up.  This type of PH is thought to have better prognosis.  He is now at goal dose of nifedipine, 120 mg daily.  Will follow clinical symptoms, echo, and BNP. If PH/RV function do not improve as desired with nifedipine XR, selective pulmonary vasodilators remain an option down the road. He has OSA but this is treated with CPAP. OSA does not explain the extent of his PH.  NYHA class I currently, not volume overloaded on exam.  6 minute walk was excellent today but still less than last walk at [facility], he says he did not push as hard as he could have.  - Continue nifedipine XR 120 mg daily.  - Check BNP today.  - He is going back to [facility] in [place] in 2/20, will have an echo at that time.  I should be able to see the test results on Care Everywhere.  If PA pressures remains significantly elevated, would consider use of selective pulmonary vasodilators.  2. Syncope: It is certainly possible that his prior episode of exertional syncope was due to untreated pulmonary hypertension.  Holter did not show significant arrhythmias, just short nocturnal pauses likely related to OSA.  3. Atrial fibrillation: Paroxysmal.  He is in NSR by exam today.  - Continue Eliquis.  - Continue Toprol XL.  4. HTN:  BP is up and down, takes ramipril "prn."  Will decrease dose to 5 mg daily and have him take it every day.   5. OSA: Using CPAP.  6. CAD: Nonobstructive on 2007 cath.  He is on atorvastatin. Good lipids in 4/19.  No ASA given apixaban use.   Followup in 5/20 (to  be seen at [facility] in 2/20).   Loralie Champagne 08/26/2018

## 2018-08-26 NOTE — Patient Instructions (Signed)
START Ramipril 5mg  daily.  90 day supply has been sent to pharmacy.  Labs today We will only contact you if something comes back abnormal or we need to make some changes. Otherwise no news is good news!  Your physician recommends that you schedule a follow-up appointment in: 6 months. Please call in [facility] to arrange appointment.

## 2018-08-26 NOTE — Progress Notes (Signed)
6 minute walk:  At start of ambulation, O2 97%, HR 76. Pt ambulated 536 meters. During walk, O2 maintained 94-95%, HR 75-82. At completion of walk, O2 95%, HR 76

## 2019-01-14 ENCOUNTER — Other Ambulatory Visit: Payer: Self-pay | Admitting: Nurse Practitioner

## 2019-01-14 ENCOUNTER — Other Ambulatory Visit: Payer: Self-pay

## 2019-01-14 NOTE — Telephone Encounter (Signed)
Pt last saw Dr Aundra Dubin 08/26/18, last labs 08/26/18 Creat 0.85, age 73, weight 92.9kg, based on specified criteria pt is on appropriate dosage of Eliquis 5mg  BID.  Will refill rx.

## 2019-05-11 ENCOUNTER — Other Ambulatory Visit (HOSPITAL_COMMUNITY): Payer: Self-pay | Admitting: *Deleted

## 2019-05-11 MED ORDER — NIFEDIPINE ER OSMOTIC RELEASE 60 MG PO TB24: 120.0000 mg | ORAL_TABLET | Freq: Every day | ORAL | 3 refills | Status: DC

## 2019-11-13 ENCOUNTER — Other Ambulatory Visit (HOSPITAL_COMMUNITY): Payer: Self-pay

## 2019-11-17 ENCOUNTER — Other Ambulatory Visit (HOSPITAL_COMMUNITY): Payer: Self-pay | Admitting: *Deleted

## 2019-11-17 MED ORDER — APIXABAN 5 MG PO TABS: ORAL_TABLET | ORAL | 9 refills | Status: DC

## 2019-11-18 ENCOUNTER — Other Ambulatory Visit (HOSPITAL_COMMUNITY): Payer: Self-pay

## 2020-01-21 MED ORDER — APIXABAN 2.5 MG PO TABS
5.00 | ORAL_TABLET | ORAL | Status: DC
Start: 2020-01-21 — End: 2020-01-21

## 2020-01-21 MED ORDER — POLYETHYLENE GLYCOL 3350 17 GM/SCOOP PO POWD
17.00 | ORAL | Status: DC
Start: 2020-01-21 — End: 2020-01-21

## 2020-01-21 MED ORDER — GENERIC EXTERNAL MEDICATION
Status: DC
Start: ? — End: 2020-01-21

## 2020-01-21 MED ORDER — FAMOTIDINE 20 MG PO TABS
20.00 | ORAL_TABLET | ORAL | Status: DC
Start: ? — End: 2020-01-21

## 2020-01-21 MED ORDER — SULFUR HEXAFLUORIDE MICROSPH 60.7-25 MG IV SUSR
INTRAVENOUS | Status: DC
Start: ? — End: 2020-01-21

## 2020-01-21 MED ORDER — DSS 100 MG PO CAPS
200.00 | ORAL_CAPSULE | ORAL | Status: DC
Start: 2020-01-21 — End: 2020-01-21

## 2020-01-21 MED ORDER — NIFEDIPINE ER OSMOTIC RELEASE 30 MG PO TB24
120.00 | ORAL_TABLET | ORAL | Status: DC
Start: 2020-01-21 — End: 2020-01-21

## 2020-01-21 MED ORDER — DOCUSATE SODIUM 283 MG RE ENEM
1.00 | ENEMA | RECTAL | Status: DC
Start: ? — End: 2020-01-21

## 2020-01-21 MED ORDER — ATORVASTATIN CALCIUM 10 MG PO TABS
10.00 | ORAL_TABLET | ORAL | Status: DC
Start: ? — End: 2020-01-21

## 2020-01-21 MED ORDER — FINASTERIDE 5 MG PO TABS
5.00 | ORAL_TABLET | ORAL | Status: DC
Start: ? — End: 2020-01-21

## 2020-01-22 MED ORDER — GENERIC EXTERNAL MEDICATION
Status: DC
Start: ? — End: 2020-01-22

## 2020-04-26 ENCOUNTER — Encounter (HOSPITAL_COMMUNITY): Payer: Self-pay | Admitting: Cardiology

## 2020-04-26 ENCOUNTER — Other Ambulatory Visit: Payer: Self-pay

## 2020-04-26 ENCOUNTER — Ambulatory Visit (HOSPITAL_COMMUNITY)
Admission: RE | Admit: 2020-04-26 | Discharge: 2020-04-26 | Disposition: A | Discharge status: Home / Self Care | Payer: 59 | Source: Ambulatory Visit | Attending: Cardiology | Admitting: Cardiology

## 2020-04-26 VITALS — BP 160/76 | HR 87 | Ht 78.0 in | Wt 213.2 lb

## 2020-04-26 DIAGNOSIS — Z7901 Long term (current) use of anticoagulants: Secondary | ICD-10-CM | POA: Insufficient documentation

## 2020-04-26 DIAGNOSIS — Z79899 Other long term (current) drug therapy: Secondary | ICD-10-CM | POA: Diagnosis not present

## 2020-04-26 DIAGNOSIS — Z8673 Personal history of transient ischemic attack (TIA), and cerebral infarction without residual deficits: Secondary | ICD-10-CM | POA: Diagnosis not present

## 2020-04-26 DIAGNOSIS — E785 Hyperlipidemia, unspecified: Secondary | ICD-10-CM | POA: Diagnosis not present

## 2020-04-26 DIAGNOSIS — N400 Enlarged prostate without lower urinary tract symptoms: Secondary | ICD-10-CM | POA: Diagnosis not present

## 2020-04-26 DIAGNOSIS — I4891 Unspecified atrial fibrillation: Secondary | ICD-10-CM

## 2020-04-26 DIAGNOSIS — Z9989 Dependence on other enabling machines and devices: Secondary | ICD-10-CM | POA: Insufficient documentation

## 2020-04-26 DIAGNOSIS — I119 Hypertensive heart disease without heart failure: Secondary | ICD-10-CM | POA: Insufficient documentation

## 2020-04-26 DIAGNOSIS — K219 Gastro-esophageal reflux disease without esophagitis: Secondary | ICD-10-CM | POA: Diagnosis not present

## 2020-04-26 DIAGNOSIS — I2510 Atherosclerotic heart disease of native coronary artery without angina pectoris: Secondary | ICD-10-CM | POA: Insufficient documentation

## 2020-04-26 DIAGNOSIS — I480 Paroxysmal atrial fibrillation: Secondary | ICD-10-CM | POA: Insufficient documentation

## 2020-04-26 DIAGNOSIS — I272 Pulmonary hypertension, unspecified: Secondary | ICD-10-CM | POA: Diagnosis not present

## 2020-04-26 DIAGNOSIS — Z8249 Family history of ischemic heart disease and other diseases of the circulatory system: Secondary | ICD-10-CM | POA: Diagnosis not present

## 2020-04-26 DIAGNOSIS — G4733 Obstructive sleep apnea (adult) (pediatric): Secondary | ICD-10-CM | POA: Insufficient documentation

## 2020-04-26 LAB — BRAIN NATRIURETIC PEPTIDE: B Natriuretic Peptide: 36.9 pg/mL (ref 0.0–100.0)

## 2020-04-26 LAB — BASIC METABOLIC PANEL
Anion gap: 7 (ref 5–15)
BUN: 12 mg/dL (ref 8–23)
CO2: 24 mmol/L (ref 22–32)
Calcium: 9.2 mg/dL (ref 8.9–10.3)
Chloride: 101 mmol/L (ref 98–111)
Creatinine, Ser: 0.75 mg/dL (ref 0.61–1.24)
GFR calc Af Amer: >60 mL/min (ref >60)
GFR calc non Af Amer: >60 mL/min (ref >60)
Glucose, Bld: 118 mg/dL — ABNORMAL HIGH (ref 70–99)
Potassium: 4.5 mmol/L (ref 3.5–5.1)
Sodium: 132 mmol/L — ABNORMAL LOW (ref 135–145)

## 2020-04-26 LAB — LIPID PANEL
Cholesterol: 146 mg/dL (ref 0–200)
HDL: 65 mg/dL (ref >40)
LDL Cholesterol: 73 mg/dL (ref 0–99)
Total CHOL/HDL Ratio: 2.2 RATIO
Triglycerides: 39 mg/dL (ref <150)
VLDL: 8 mg/dL (ref 0–40)

## 2020-04-26 LAB — CBC
HCT: 47.3 % (ref 39.0–52.0)
Hemoglobin: 15.5 g/dL (ref 13.0–17.0)
MCH: 30.6 pg (ref 26.0–34.0)
MCHC: 32.8 g/dL (ref 30.0–36.0)
MCV: 93.5 fL (ref 80.0–100.0)
Platelets: 196 10*3/uL (ref 150–400)
RBC: 5.06 MIL/uL (ref 4.22–5.81)
RDW: 13.2 % (ref 11.5–15.5)
WBC: 3.6 10*3/uL — ABNORMAL LOW (ref 4.0–10.5)
nRBC: 0 % (ref 0.0–0.2)

## 2020-04-26 NOTE — Patient Instructions (Addendum)
Labs done today. We will contact you only if your labs are abnormal.  START Ramipril 5mg (1 tablet) by mouth daily.  No other medication changes were made. Please continue all other current medications as prescribed.   Your physician recommends that you schedule keep your pending appointment with Dr. Aundra Dubin Monday, September 20th at 2:20pm  If you have any questions or concerns before your next appointment please send [place] a message through [place] or call our office at [phone].    TO LEAVE A MESSAGE FOR THE NURSE SELECT OPTION 2, PLEASE LEAVE A MESSAGE INCLUDING: . YOUR NAME . DATE OF BIRTH . CALL BACK NUMBER . REASON FOR CALL**this is important as we prioritize the call backs  [place] AS LONG AS YOU CALL BEFORE 4:00 PM  Your physician has requested that you have cardiac CT. Cardiac computed tomography (CT) is a painless test that uses an x-ray machine to take clear, detailed pictures of your heart. For further information please visit [email or web address]. Please follow instruction sheet as given. This must be approved by your insurance company prior to scheduling. Once approved our office will contact you to schedule an appointment.   Do the following things EVERYDAY: 1) Weigh yourself in the morning before breakfast. Write it down and keep it in a log. 2) Take your medicines as prescribed 3) Eat low salt foods--Limit salt (sodium) to 2000 mg per day.  4) Stay as active as you can everyday 5) Limit all fluids for the day to less than 2 liters    At the [facility], you and your health needs are our priority. As part of our continuing mission to provide you with exceptional heart care, we have created designated Provider Care Teams. These Care Teams include your primary Cardiologist (physician) and Advanced Practice Providers (APPs- Physician Assistants and Nurse Practitioners) who all work together to provide you with the care you  need, when you need it.   You may see any of the following providers on your designated Care Team at your next follow up: Marland Kitchen Dr Glori Bickers . Dr Loralie Champagne . Darrick Grinder, NP . Lyda Jester, PA . Audry Riles, PharmD   Please be sure to bring in all your medications bottles to every appointment.   You are scheduled for a Cardiac Catheterization on Wednesday, August 11 with Dr. Loralie Champagne.  1. Please arrive at the [facility] (Main Entrance A) at [facility]: [address] [place], [place] [address] at 9:00 AM (This time is two hours before your procedure to ensure your preparation). Free valet parking service is available.   Special note: Every effort is made to have your procedure done on time. Please understand that emergencies sometimes delay scheduled procedures.  2. Diet: Do not eat solid foods after midnight.  The patient may have clear liquids until 5am upon the day of the procedure.  3. Labs: you will need to have a pre procedure COVID-19 test done prior to your Cath on Monday August 9th between the hours of 8-3:00pm. The address is [address] [place], [place] [address]. This a drive-thru site. YOU MUST SELF QUARANTINE UNTIL YOUR HEART CATH.   4. Medication instructions in preparation for your procedure  The night before and on the morning of your procedure, DO NOT take your Eliquis. However, you make this rest of your morning medications. You may use sips of water.  5. Plan for one night stay--bring  personal belongings. 6. Bring a current list of your medications and current insurance cards. 7. You MUST have a responsible person to drive you home. 8. Someone MUST be with you the first 24 hours after you arrive home or your discharge will be delayed. 9. Please wear clothes that are easy to get on and off and wear slip-on shoes.  Thank you for allowing [place] to care for you!   -- [facility]

## 2020-04-27 NOTE — Progress Notes (Signed)
PCP: Josetta Huddle, MD Cardiology: Dr. Aundra Dubin EP: Dr. Caryl Comes  74 y.o. with history of nonobstructive CAD and paroxysmal atrial fibrillation returns for followup of pulmonary hypertension.  Patient had ablations for atrial fibrillation in 2009 and 2010. He has OSA and uses CPAP regularly.  He had a cath in 2007 with nonobstructive CAD and a Cardiolite in 2017 without ischemia.    In 5/19, he was in [place], [place].  He was on a treadmill doing his usual routine when he began to feel "funny."  He had an odd feeling in his abdomen.  He lost consciousness and fell to the ground.  He was not unconscious for very long.  He was taken to the [facility].  While there, he had a very extensive workup.  Echo showed pulmonary hypertension with signs of RV failure.  V/Q scan showed no chronic PE and CT chest did not show significant abnormalities.  RHC was done, this showed pulmonary hypertension that appeared vasoreactive with NO testing (see PMH section below for numbers).  He was started on nifedipine XR.  This has been titrated up to 120 mg daily.  He had an echo again in 7/19 at the [facility], this showed moderate RV dilation, borderline decreased RV function.  RVSP was down to 54 mmHg from 74 mmHg.   He had additional episodes of atrial fibrillation in 2021, and ended up having repeat atrial fibrillation ablation in 4/21 at the [facility] in [place].  He had a TIA post-procedure.  Pre-procedure, CT for pulmonary veins showed mild PV narrowing, especially of the left superior pulmonary vein.   Last echo was in 6/21 at the [facility] this showed EF 65%, moderate RV enlargement, mildly decreased RV systolic function, severe RAE, moderate PI, normal IVC (unable to estimate PASP).    He has had episodes of atypical atrial flutter since last ablation.   Since the last ablation, he has been more short of breath with exertion.  He notes dyspnea with stairs and hills which is new.  He still exercises  30-45 minutes/day on a treadmill, going about 4 miles.  No chest pain.  No lightheadedness.  No syncope/presyncope.  Rare palpitations. BP is high today and has been elevated when he checks at home.  He has not been taking ramipril regularly. He is compliant with CPAP.   Labs (4/19): K 4.9, creatinine 1.02, LDL 62 Labs (6/21): creatinine 0.89, NT-pro-BNP 196  6 minute walk (5/19): 616 m [facility]) 6 minute walk (7/19): 693 m [facility]) 6 minute walk (11/19): 536 m  ECG (personally reviewed): NSR, 1st degree AVB  PMH: 1. Atrial fibrillation: Paroxysmal.  S/p ablation in 2009, then repeat ablation in 2010 at [facility].   - Ablation 4/21 at [facility].  2. TIAs: Initially in 1990s, had another TIA post-ablation in 4/21.  3. GERD 4. HTN 5. Hyperlipidemia 6. BPH 7. OSA: Uses CPAP 8. CAD: LHC in 2007 with nonobstructive disease.  - Cardiolite in 2017 did not show ischemia or infarction.  9. Pulmonary hypertension:  Diagnosed after syncopal episode on treadmill in 5/19.  - V/Q scan (5/19): No evidence for chronic PE - ANA, TSH, HIV negative (5/19) - CT chest (5/19): No evidence for ILD.  - PFTs (5/19): Mild obstruction.  - Echo (5/19): EF 65-70%, IV septal flattening with moderately enlarged RV with moderately decreased systolic function. PASP 74 mmHg.  - RHC (5/19): Initial RHC with mean RA 4, RV 62/7, PA 68/19 mean 40, PCWP  22, CI 2.45, PVR 3 WU => primarily pulmonary venous hypertension with no evidence for shunt lesion (PA sat 70%, RA sat 69%).  He was then given NTG sprays and RHC was repeated, showing fall in PCWP out of proportion to fall in PCWP and rise in PVR to 3.46 => unmasked PAH.  Next, he was started on inhaled NO and numbers were repeated: mean PA 19, PCWP 9, CI 2.7, PVR 1.5 => correction with NO, so vasoreactive.  - Start on nifedipine CR as vasoreactive.  - Echo (7/19): EF 58%, moderate RV dilation with borderline decreased systolic function, RVSP 54 mmHg.  - Echo (6/21):  EF 65%, moderate RV enlargement, mildly decreased RV systolic function, severe RAE, moderate PI, normal IVC (unable to estimate PASP).  10. Holter (6/19) with occasional up to 2.8 sec nocturnal pauses.   SH: Married, lives in [place], 2 daughters, [facility], never smoked.   Family History  Problem Relation Age of Onset  . Heart attack Father   . Coronary artery disease Father   . Transient ischemic attack Mother   . Coronary artery disease Mother        h/o TIA   . Stroke Mother   . Colon cancer Other        aunt and uncle   ROS: All systems reviewed and negative except as per HPI.   Current Outpatient Medications  Medication Sig Dispense Refill  . apixaban (ELIQUIS) 5 MG TABS tablet TAKE 1 TABLET(5 MG) BY MOUTH TWICE DAILY (Patient taking differently: Take 5 mg by mouth 2 (two) times daily. ) 60 tablet 9  . atorvastatin (LIPITOR) 10 MG tablet Take 10 mg by mouth daily.    . citalopram (CELEXA) 20 MG tablet Take 20 mg by mouth daily.    Marland Kitchen docusate sodium (COLACE) 100 MG capsule Take 200 mg by mouth daily.     . finasteride (PROSCAR) 5 MG tablet Take 5 mg by mouth daily.      Marland Kitchen NIFEdipine (PROCARDIA XL/NIFEDICAL XL) 60 MG 24 hr tablet Take 2 tablets (120 mg total) by mouth daily. (Patient taking differently: Take 60 mg by mouth 2 (two) times daily. ) 180 tablet 3  . polyethylene glycol powder (GLYCOLAX/MIRALAX) 17 GM/SCOOP powder Take 17 g by mouth daily.     . ramipril (ALTACE) 5 MG capsule Take 1 capsule (5 mg total) by mouth daily. 90 capsule 3  . tamsulosin (FLOMAX) 0.4 MG CAPS capsule Take 0.4 mg by mouth daily.   11  . Cholecalciferol (DIALYVITE VITAMIN D 5000) 125 MCG (5000 UT) capsule Take 5,000 Units by mouth daily.    . Coenzyme Q10 (COQ10) 100 MG CAPS Take 100 mg by mouth daily.    Marland Kitchen FIBER PO Take 5 capsules by mouth daily.    . hydrocortisone cream 1 % Apply 1 application topically daily as needed for itching.    . metoprolol succinate (TOPROL-XL)  50 MG 24 hr tablet Take 25 mg by mouth daily. Take with or immediately following a meal.    . Multiple Vitamin (MULTIVITAMIN WITH MINERALS) TABS tablet Take 1 tablet by mouth daily.    . Psyllium (METAMUCIL PO) Take 1 Scoop by mouth daily.    Marland Kitchen triamcinolone cream (KENALOG) 0.1 % Apply 1 application topically daily as needed (rash).     No current facility-administered medications for this encounter.   BP (!) 160/76   Pulse 87   Ht 6\' 6"  (1.981 m)   Wt 96.7 kg (  213 lb 3.2 oz)   SpO2 96%   BMI 24.64 kg/m  General: NAD Neck: No JVD, no thyromegaly or thyroid nodule.  Lungs: Clear to auscultation bilaterally with normal respiratory effort. CV: Nondisplaced PMI.  Heart regular S1/S2, no S3/S4, no murmur.  No peripheral edema.  No carotid bruit.  Normal pedal pulses.  Abdomen: Soft, nontender, no hepatosplenomegaly, no distention.  Skin: Intact without lesions or rashes.  Neurologic: Alert and oriented x 3.  Psych: Normal affect. Extremities: No clubbing or cyanosis.  HEENT: Normal.   Assessment/Plan: 1. Pulmonary hypertension: This may be the cause of his syncopal episode in 5/19. Echo in 5/19 showed elevated PA pressure with RV dysfunction.  V/Q scan and CT chest were negative.  Serologic workup was negative. Initial RHC looked like pulmonary venous hypertension but NTG spray was able to unmask PAH.  NO challenge in the cath lab led to a significant response with elimination of PH and fall in PVR to 1.5 WU. Therefore, patient was thought to have vasoreactive PH and was started on nifedipine XR 30 mg daily and titrated up. He is now at goal dose of nifedipine, 120 mg daily.  He has OSA but this is treated with CPAP. OSA does not explain the extent of his PH.  Often, CCB responsive PH shows decrement in effect of the CCB treatment over time, requiring the addition of selective pulmonary vasodilators.  Recently, patient has had increased dyspnea.  Echo in 6/21 showed that the RV was still  moderately dilated with mildly decreased systolic function.  I am concerned that he has residual significant PAH versus pulmonary venous hypertension from pulmonary vein stenosis post redo PVI in 4/21.   - Continue nifedipine XR 120 mg daily.  - Check BNP today.  - I will arrange for RHC. If residual significant PAH, would consider addition of Opsumit.  We discussed risks/benefits of procedure and he agreed to undergo.  2. Atrial fibrillation: Paroxysmal.  NSR today.  He is s/p redo ablation in 4/21.  Now with increased dyspnea. CT of chest for pulmonary veins prior to last ablation showed mild PV narrowing.   - Continue Eliquis. CBC today.  - Continue Toprol XL.  - He will need CTA chest for evaluation of pulmonary veins to look for worsening pulmonary vein stenosis post-recent redo ablation.  Will also try to time for coronaries to rule out obstructive CAD.  3. HTN:  BP high, needs to take ramipril 5 mg daily.  BMET today.   4. OSA: Using CPAP.  5. CAD: Nonobstructive on 2007 cath.  He is on atorvastatin. Check lipids today.  No ASA given apixaban use.   Followup after cath.   Loralie Champagne 04/27/2020

## 2020-04-27 NOTE — H&P (View-Only) (Signed)
PCP: Josetta Huddle, MD Cardiology: Dr. Aundra Dubin EP: Dr. Caryl Comes  74 y.o. with history of nonobstructive CAD and paroxysmal atrial fibrillation returns for followup of pulmonary hypertension.  Patient had ablations for atrial fibrillation in 2009 and 2010. He has OSA and uses CPAP regularly.  He had a cath in 2007 with nonobstructive CAD and a Cardiolite in 2017 without ischemia.    In 5/19, he was in [place], [place].  He was on a treadmill doing his usual routine when he began to feel "funny."  He had an odd feeling in his abdomen.  He lost consciousness and fell to the ground.  He was not unconscious for very long.  He was taken to the [facility].  While there, he had a very extensive workup.  Echo showed pulmonary hypertension with signs of RV failure.  V/Q scan showed no chronic PE and CT chest did not show significant abnormalities.  RHC was done, this showed pulmonary hypertension that appeared vasoreactive with NO testing (see PMH section below for numbers).  He was started on nifedipine XR.  This has been titrated up to 120 mg daily.  He had an echo again in 7/19 at the [facility], this showed moderate RV dilation, borderline decreased RV function.  RVSP was down to 54 mmHg from 74 mmHg.   He had additional episodes of atrial fibrillation in 2021, and ended up having repeat atrial fibrillation ablation in 4/21 at the [facility] in [place].  He had a TIA post-procedure.  Pre-procedure, CT for pulmonary veins showed mild PV narrowing, especially of the left superior pulmonary vein.   Last echo was in 6/21 at the [facility], this showed EF 65%, moderate RV enlargement, mildly decreased RV systolic function, severe RAE, moderate PI, normal IVC (unable to estimate PASP).    He has had episodes of atypical atrial flutter since last ablation.   Since the last ablation, he has been more short of breath with exertion.  He notes dyspnea with stairs and hills which is new.  He still exercises  30-45 minutes/day on a treadmill, going about 4 miles.  No chest pain.  No lightheadedness.  No syncope/presyncope.  Rare palpitations. BP is high today and has been elevated when he checks at home.  He has not been taking ramipril regularly. He is compliant with CPAP.   Labs (4/19): K 4.9, creatinine 1.02, LDL 62 Labs (6/21): creatinine 0.89, NT-pro-BNP 196  6 minute walk (5/19): 616 m [facility]) 6 minute walk (7/19): 693 m [facility]) 6 minute walk (11/19): 536 m  ECG (personally reviewed): NSR, 1st degree AVB  PMH: 1. Atrial fibrillation: Paroxysmal.  S/p ablation in 2009, then repeat ablation in 2010 at [facility].   - Ablation 4/21 at [facility].  2. TIAs: Initially in 1990s, had another TIA post-ablation in 4/21.  3. GERD 4. HTN 5. Hyperlipidemia 6. BPH 7. OSA: Uses CPAP 8. CAD: LHC in 2007 with nonobstructive disease.  - Cardiolite in 2017 did not show ischemia or infarction.  9. Pulmonary hypertension:  Diagnosed after syncopal episode on treadmill in 5/19.  - V/Q scan (5/19): No evidence for chronic PE - ANA, TSH, HIV negative (5/19) - CT chest (5/19): No evidence for ILD.  - PFTs (5/19): Mild obstruction.  - Echo (5/19): EF 65-70%, IV septal flattening with moderately enlarged RV with moderately decreased systolic function. PASP 74 mmHg.  - RHC (5/19): Initial RHC with mean RA 4, RV 62/7, PA 68/19 mean 40, PCWP  22, CI 2.45, PVR 3 WU => primarily pulmonary venous hypertension with no evidence for shunt lesion (PA sat 70%, RA sat 69%).  He was then given NTG sprays and RHC was repeated, showing fall in PCWP out of proportion to fall in PCWP and rise in PVR to 3.46 => unmasked PAH.  Next, he was started on inhaled NO and numbers were repeated: mean PA 19, PCWP 9, CI 2.7, PVR 1.5 => correction with NO, so vasoreactive.  - Start on nifedipine CR as vasoreactive.  - Echo (7/19): EF 58%, moderate RV dilation with borderline decreased systolic function, RVSP 54 mmHg.  - Echo (6/21):  EF 65%, moderate RV enlargement, mildly decreased RV systolic function, severe RAE, moderate PI, normal IVC (unable to estimate PASP).  10. Holter (6/19) with occasional up to 2.8 sec nocturnal pauses.   SH: Married, lives in [place], 2 daughters, [facility], never smoked.   Family History  Problem Relation Age of Onset  . Heart attack Father   . Coronary artery disease Father   . Transient ischemic attack Mother   . Coronary artery disease Mother        h/o TIA   . Stroke Mother   . Colon cancer Other        aunt and uncle   ROS: All systems reviewed and negative except as per HPI.   Current Outpatient Medications  Medication Sig Dispense Refill  . apixaban (ELIQUIS) 5 MG TABS tablet TAKE 1 TABLET(5 MG) BY MOUTH TWICE DAILY (Patient taking differently: Take 5 mg by mouth 2 (two) times daily. ) 60 tablet 9  . atorvastatin (LIPITOR) 10 MG tablet Take 10 mg by mouth daily.    . citalopram (CELEXA) 20 MG tablet Take 20 mg by mouth daily.    Marland Kitchen docusate sodium (COLACE) 100 MG capsule Take 200 mg by mouth daily.     . finasteride (PROSCAR) 5 MG tablet Take 5 mg by mouth daily.      Marland Kitchen NIFEdipine (PROCARDIA XL/NIFEDICAL XL) 60 MG 24 hr tablet Take 2 tablets (120 mg total) by mouth daily. (Patient taking differently: Take 60 mg by mouth 2 (two) times daily. ) 180 tablet 3  . polyethylene glycol powder (GLYCOLAX/MIRALAX) 17 GM/SCOOP powder Take 17 g by mouth daily.     . ramipril (ALTACE) 5 MG capsule Take 1 capsule (5 mg total) by mouth daily. 90 capsule 3  . tamsulosin (FLOMAX) 0.4 MG CAPS capsule Take 0.4 mg by mouth daily.   11  . Cholecalciferol (DIALYVITE VITAMIN D 5000) 125 MCG (5000 UT) capsule Take 5,000 Units by mouth daily.    . Coenzyme Q10 (COQ10) 100 MG CAPS Take 100 mg by mouth daily.    Marland Kitchen FIBER PO Take 5 capsules by mouth daily.    . hydrocortisone cream 1 % Apply 1 application topically daily as needed for itching.    . metoprolol succinate (TOPROL-XL)  50 MG 24 hr tablet Take 25 mg by mouth daily. Take with or immediately following a meal.    . Multiple Vitamin (MULTIVITAMIN WITH MINERALS) TABS tablet Take 1 tablet by mouth daily.    . Psyllium (METAMUCIL PO) Take 1 Scoop by mouth daily.    Marland Kitchen triamcinolone cream (KENALOG) 0.1 % Apply 1 application topically daily as needed (rash).     No current facility-administered medications for this encounter.   BP (!) 160/76   Pulse 87   Ht 6\' 6"  (1.981 m)   Wt 96.7 kg (  213 lb 3.2 oz)   SpO2 96%   BMI 24.64 kg/m  General: NAD Neck: No JVD, no thyromegaly or thyroid nodule.  Lungs: Clear to auscultation bilaterally with normal respiratory effort. CV: Nondisplaced PMI.  Heart regular S1/S2, no S3/S4, no murmur.  No peripheral edema.  No carotid bruit.  Normal pedal pulses.  Abdomen: Soft, nontender, no hepatosplenomegaly, no distention.  Skin: Intact without lesions or rashes.  Neurologic: Alert and oriented x 3.  Psych: Normal affect. Extremities: No clubbing or cyanosis.  HEENT: Normal.   Assessment/Plan: 1. Pulmonary hypertension: This may be the cause of his syncopal episode in 5/19. Echo in 5/19 showed elevated PA pressure with RV dysfunction.  V/Q scan and CT chest were negative.  Serologic workup was negative. Initial RHC looked like pulmonary venous hypertension but NTG spray was able to unmask PAH.  NO challenge in the cath lab led to a significant response with elimination of PH and fall in PVR to 1.5 WU. Therefore, patient was thought to have vasoreactive PH and was started on nifedipine XR 30 mg daily and titrated up. He is now at goal dose of nifedipine, 120 mg daily.  He has OSA but this is treated with CPAP. OSA does not explain the extent of his PH.  Often, CCB responsive PH shows decrement in effect of the CCB treatment over time, requiring the addition of selective pulmonary vasodilators.  Recently, patient has had increased dyspnea.  Echo in 6/21 showed that the RV was still  moderately dilated with mildly decreased systolic function.  I am concerned that he has residual significant PAH versus pulmonary venous hypertension from pulmonary vein stenosis post redo PVI in 4/21.   - Continue nifedipine XR 120 mg daily.  - Check BNP today.  - I will arrange for RHC. If residual significant PAH, would consider addition of Opsumit.  We discussed risks/benefits of procedure and he agreed to undergo.  2. Atrial fibrillation: Paroxysmal.  NSR today.  He is s/p redo ablation in 4/21.  Now with increased dyspnea. CT of chest for pulmonary veins prior to last ablation showed mild PV narrowing.   - Continue Eliquis. CBC today.  - Continue Toprol XL.  - He will need CTA chest for evaluation of pulmonary veins to look for worsening pulmonary vein stenosis post-recent redo ablation.  Will also try to time for coronaries to rule out obstructive CAD.  3. HTN:  BP high, needs to take ramipril 5 mg daily.  BMET today.   4. OSA: Using CPAP.  5. CAD: Nonobstructive on 2007 cath.  He is on atorvastatin. Check lipids today.  No ASA given apixaban use.   Followup after cath.   Loralie Champagne 04/27/2020

## 2020-04-28 ENCOUNTER — Telehealth (HOSPITAL_COMMUNITY): Payer: Self-pay

## 2020-04-28 DIAGNOSIS — I272 Pulmonary hypertension, unspecified: Secondary | ICD-10-CM

## 2020-04-28 MED ORDER — ATORVASTATIN CALCIUM 20 MG PO TABS: 20.0000 mg | ORAL_TABLET | Freq: Every day | ORAL | 3 refills | Status: AC

## 2020-04-28 NOTE — Telephone Encounter (Signed)
-----   Message from Larey Dresser, MD sent at 04/26/2020  5:00 PM EDT ----- Increase atorvastatin to 20 mg daily for goal LDL < 70. Lipids/LFTs 2 months.

## 2020-04-28 NOTE — Telephone Encounter (Signed)
Patient advised and verbalized understanding. Rx sent into patients pharmacy,lab orders entered.    Orders Placed This Encounter  Procedures  . Lipid Profile    Standing Status:   Future    Standing Expiration Date:   04/28/2021    Order Specific Question:   Release to patient    Answer:   Immediate  . Hepatic function panel    Standing Status:   Future    Standing Expiration Date:   04/28/2021    Order Specific Question:   Release to patient    Answer:   Immediate   Meds ordered this encounter  Medications  . atorvastatin (LIPITOR) 20 MG tablet    Sig: Take 1 tablet (20 mg total) by mouth daily.    Dispense:  90 tablet    Refill:  3    Please cancel all previous orders for current medication. Change in dosage or pill size.

## 2020-04-29 ENCOUNTER — Other Ambulatory Visit (HOSPITAL_COMMUNITY): Payer: Self-pay | Admitting: *Deleted

## 2020-04-29 DIAGNOSIS — I272 Pulmonary hypertension, unspecified: Secondary | ICD-10-CM

## 2020-04-29 MED ORDER — SODIUM CHLORIDE 0.9% FLUSH: 3.0000 mL | Freq: Two times a day (BID) | INTRAVENOUS | Status: DC

## 2020-05-02 ENCOUNTER — Other Ambulatory Visit (HOSPITAL_COMMUNITY)
Admission: RE | Admit: 2020-05-02 | Discharge: 2020-05-02 | Disposition: A | Discharge status: Home / Self Care | Payer: 59 | Source: Ambulatory Visit | Attending: Cardiology | Admitting: Cardiology

## 2020-05-02 DIAGNOSIS — Z01812 Encounter for preprocedural laboratory examination: Secondary | ICD-10-CM | POA: Diagnosis not present

## 2020-05-02 DIAGNOSIS — Z20822 Contact with and (suspected) exposure to covid-19: Secondary | ICD-10-CM | POA: Diagnosis not present

## 2020-05-02 LAB — SARS CORONAVIRUS 2 (TAT 6-24 HRS): SARS Coronavirus 2: NEGATIVE

## 2020-05-04 ENCOUNTER — Encounter (HOSPITAL_COMMUNITY)
Admission: RE | Disposition: A | Discharge status: Home / Self Care | Payer: Self-pay | Source: Home / Self Care | Attending: Cardiology

## 2020-05-04 ENCOUNTER — Ambulatory Visit (HOSPITAL_COMMUNITY)
Admission: RE | Admit: 2020-05-04 | Discharge: 2020-05-04 | Disposition: A | Discharge status: Home / Self Care | Payer: BC Managed Care – PPO | Attending: Cardiology | Admitting: Cardiology

## 2020-05-04 ENCOUNTER — Other Ambulatory Visit: Payer: Self-pay

## 2020-05-04 DIAGNOSIS — I110 Hypertensive heart disease with heart failure: Secondary | ICD-10-CM | POA: Diagnosis not present

## 2020-05-04 DIAGNOSIS — E785 Hyperlipidemia, unspecified: Secondary | ICD-10-CM | POA: Diagnosis not present

## 2020-05-04 DIAGNOSIS — K219 Gastro-esophageal reflux disease without esophagitis: Secondary | ICD-10-CM | POA: Diagnosis not present

## 2020-05-04 DIAGNOSIS — Z7901 Long term (current) use of anticoagulants: Secondary | ICD-10-CM | POA: Insufficient documentation

## 2020-05-04 DIAGNOSIS — I2510 Atherosclerotic heart disease of native coronary artery without angina pectoris: Secondary | ICD-10-CM | POA: Diagnosis not present

## 2020-05-04 DIAGNOSIS — Z8673 Personal history of transient ischemic attack (TIA), and cerebral infarction without residual deficits: Secondary | ICD-10-CM | POA: Insufficient documentation

## 2020-05-04 DIAGNOSIS — Z79899 Other long term (current) drug therapy: Secondary | ICD-10-CM | POA: Diagnosis not present

## 2020-05-04 DIAGNOSIS — I480 Paroxysmal atrial fibrillation: Secondary | ICD-10-CM | POA: Insufficient documentation

## 2020-05-04 DIAGNOSIS — N400 Enlarged prostate without lower urinary tract symptoms: Secondary | ICD-10-CM | POA: Diagnosis not present

## 2020-05-04 DIAGNOSIS — G4733 Obstructive sleep apnea (adult) (pediatric): Secondary | ICD-10-CM | POA: Diagnosis not present

## 2020-05-04 DIAGNOSIS — I509 Heart failure, unspecified: Secondary | ICD-10-CM | POA: Insufficient documentation

## 2020-05-04 DIAGNOSIS — I2721 Secondary pulmonary arterial hypertension: Secondary | ICD-10-CM | POA: Diagnosis not present

## 2020-05-04 DIAGNOSIS — I272 Pulmonary hypertension, unspecified: Secondary | ICD-10-CM | POA: Diagnosis not present

## 2020-05-04 HISTORY — PX: RIGHT HEART CATH: CATH118263

## 2020-05-04 LAB — POCT I-STAT EG7
Acid-Base Excess: 1 mmol/L (ref 0.0–2.0)
Acid-Base Excess: 2 mmol/L (ref 0.0–2.0)
Acid-Base Excess: 2 mmol/L (ref 0.0–2.0)
Bicarbonate: 25.6 mmol/L (ref 20.0–28.0)
Bicarbonate: 26.8 mmol/L (ref 20.0–28.0)
Bicarbonate: 27.2 mmol/L (ref 20.0–28.0)
Calcium, Ion: 1.08 mmol/L — ABNORMAL LOW (ref 1.15–1.40)
Calcium, Ion: 1.16 mmol/L (ref 1.15–1.40)
Calcium, Ion: 1.17 mmol/L (ref 1.15–1.40)
HCT: 44 % (ref 39.0–52.0)
HCT: 45 % (ref 39.0–52.0)
HCT: 46 % (ref 39.0–52.0)
Hemoglobin: 15 g/dL (ref 13.0–17.0)
Hemoglobin: 15.3 g/dL (ref 13.0–17.0)
Hemoglobin: 15.6 g/dL (ref 13.0–17.0)
O2 Saturation: 77 %
O2 Saturation: 78 %
O2 Saturation: 80 %
Potassium: 3.8 mmol/L (ref 3.5–5.1)
Potassium: 4 mmol/L (ref 3.5–5.1)
Potassium: 4.1 mmol/L (ref 3.5–5.1)
Sodium: 136 mmol/L (ref 135–145)
Sodium: 138 mmol/L (ref 135–145)
Sodium: 139 mmol/L (ref 135–145)
TCO2: 27 mmol/L (ref 22–32)
TCO2: 28 mmol/L (ref 22–32)
TCO2: 29 mmol/L (ref 22–32)
pCO2, Ven: 41.3 mmHg — ABNORMAL LOW (ref 44.0–60.0)
pCO2, Ven: 42.7 mmHg — ABNORMAL LOW (ref 44.0–60.0)
pCO2, Ven: 45 mmHg (ref 44.0–60.0)
pH, Ven: 7.389 (ref 7.250–7.430)
pH, Ven: 7.4 (ref 7.250–7.430)
pH, Ven: 7.405 (ref 7.250–7.430)
pO2, Ven: 42 mmHg (ref 32.0–45.0)
pO2, Ven: 43 mmHg (ref 32.0–45.0)
pO2, Ven: 44 mmHg (ref 32.0–45.0)

## 2020-05-04 SURGERY — RIGHT HEART CATH
Anesthesia: LOCAL

## 2020-05-04 MED ORDER — LIDOCAINE HCL (PF) 1 % IJ SOLN
INTRAMUSCULAR | Status: AC
  Filled 2020-05-04: qty 30

## 2020-05-04 MED ORDER — HEPARIN (PORCINE) IN NACL 1000-0.9 UT/500ML-% IV SOLN
INTRAVENOUS | Status: DC | PRN
  Administered 2020-05-04: 500 mL

## 2020-05-04 MED ORDER — ACETAMINOPHEN 325 MG PO TABS: 650.0000 mg | ORAL_TABLET | ORAL | Status: DC | PRN

## 2020-05-04 MED ORDER — HEPARIN (PORCINE) IN NACL 1000-0.9 UT/500ML-% IV SOLN
INTRAVENOUS | Status: AC
  Filled 2020-05-04: qty 500

## 2020-05-04 MED ORDER — SODIUM CHLORIDE 0.9 % IV SOLN: 250.0000 mL | INTRAVENOUS | Status: DC | PRN

## 2020-05-04 MED ORDER — LABETALOL HCL 5 MG/ML IV SOLN: 10.0000 mg | INTRAVENOUS | Status: DC | PRN

## 2020-05-04 MED ORDER — ASPIRIN 81 MG PO CHEW
81.0000 mg | CHEWABLE_TABLET | ORAL | Status: AC
  Administered 2020-05-04: 81 mg via ORAL
  Filled 2020-05-04: qty 1

## 2020-05-04 MED ORDER — SODIUM CHLORIDE 0.9 % IV SOLN: INTRAVENOUS | Status: DC

## 2020-05-04 MED ORDER — LIDOCAINE HCL (PF) 1 % IJ SOLN
INTRAMUSCULAR | Status: DC | PRN
  Administered 2020-05-04: 1 mL

## 2020-05-04 MED ORDER — HYDRALAZINE HCL 20 MG/ML IJ SOLN: 10.0000 mg | INTRAMUSCULAR | Status: DC | PRN

## 2020-05-04 MED ORDER — ONDANSETRON HCL 4 MG/2ML IJ SOLN: 4.0000 mg | Freq: Four times a day (QID) | INTRAMUSCULAR | Status: DC | PRN

## 2020-05-04 MED ORDER — SODIUM CHLORIDE 0.9% FLUSH: 3.0000 mL | INTRAVENOUS | Status: DC | PRN

## 2020-05-04 MED ORDER — SODIUM CHLORIDE 0.9% FLUSH: 3.0000 mL | Freq: Two times a day (BID) | INTRAVENOUS | Status: DC

## 2020-05-04 SURGICAL SUPPLY — 9 items
CATH BALLN WEDGE 5F 110CM (CATHETERS) ×2 IMPLANT
PACK CARDIAC CATHETERIZATION (CUSTOM PROCEDURE TRAY) ×2 IMPLANT
PROTECTION STATION PRESSURIZED (MISCELLANEOUS) ×2
SHEATH GLIDE SLENDER 4/5FR (SHEATH) ×2 IMPLANT
STATION PROTECTION PRESSURIZED (MISCELLANEOUS) ×1 IMPLANT
TRANSDUCER W/STOPCOCK (MISCELLANEOUS) ×2 IMPLANT
TUBING ART PRESS 72  MALE/FEM (TUBING) ×2
TUBING ART PRESS 72 MALE/FEM (TUBING) ×1 IMPLANT
WIRE EMERALD 3MM-J .025X260CM (WIRE) ×2 IMPLANT

## 2020-05-04 NOTE — Discharge Instructions (Signed)
Radial Site Care  This sheet gives you information about how to care for yourself after your procedure. Your health care provider may also give you more specific instructions. If you have problems or questions, contact your health care provider. What can I expect after the procedure? After the procedure, it is common to have:  Bruising and tenderness at the catheter insertion area. Follow these instructions at home: Medicines  Take over-the-counter and prescription medicines only as told by your health care provider. Insertion site care  Follow instructions from your health care provider about how to take care of your insertion site. Make sure you: ? Wash your hands with soap and water before you change your bandage (dressing). If soap and water are not available, use hand sanitizer. ? Change your dressing as told by your health care provider. ? Leave stitches (sutures), skin glue, or adhesive strips in place. These skin closures may need to stay in place for 2 weeks or longer. If adhesive strip edges start to loosen and curl up, you may trim the loose edges. Do not remove adhesive strips completely unless your health care provider tells you to do that.  Check your insertion site every day for signs of infection. Check for: ? Redness, swelling, or pain. ? Fluid or blood. ? Pus or a bad smell. ? Warmth.  Do not take baths, swim, or use a hot tub until your health care provider approves.  You may shower 24-48 hours after the procedure, or as directed by your health care provider. ? Remove the dressing and gently wash the site with plain soap and water. ? Pat the area dry with a clean towel. ? Do not rub the site. That could cause bleeding.  Do not apply powder or lotion to the site. Activity   For 24 hours after the procedure, or as directed by your health care provider: ? Do not flex or bend the affected arm. ? Do not push or pull heavy objects with the affected arm. ? Do not  drive yourself home from the hospital or clinic. You may drive 24 hours after the procedure unless your health care provider tells you not to. ? Do not operate machinery or power tools.  Do not lift anything that is heavier than 10 lb (4.5 kg), or the limit that you are told, until your health care provider says that it is safe.  Ask your health care provider when it is okay to: ? Return to work or school. ? Resume usual physical activities or sports. ? Resume sexual activity. General instructions  If the catheter site starts to bleed, raise your arm and put firm pressure on the site. If the bleeding does not stop, get help right away. This is a medical emergency.  If you went home on the same day as your procedure, a responsible adult should be with you for the first 24 hours after you arrive home.  Keep all follow-up visits as told by your health care provider. This is important. Contact a health care provider if:  You have a fever.  You have redness, swelling, or yellow drainage around your insertion site. Get help right away if:  You have unusual pain at the radial site.  The catheter insertion area swells very fast.  The insertion area is bleeding, and the bleeding does not stop when you hold steady pressure on the area.  Your arm or hand becomes pale, cool, tingly, or numb. These symptoms may represent a serious problem   that is an emergency. Do not wait to see if the symptoms will go away. Get medical help right away. Call your local emergency services (911 in the [place] Do not drive yourself to the hospital. Summary  After the procedure, it is common to have bruising and tenderness at the site.  Follow instructions from your health care provider about how to take care of your radial site wound. Check the wound every day for signs of infection.  Do not lift anything that is heavier than 10 lb (4.5 kg), or the limit that you are told, until your health care provider says  that it is safe. This information is not intended to replace advice given to you by your health care provider. Make sure you discuss any questions you have with your health care provider. Document Revised: 10/16/2017 Document Reviewed: 10/16/2017 Elsevier Patient Education  2020 [employer]  

## 2020-05-04 NOTE — Interval H&P Note (Signed)
History and Physical Interval Note:  05/04/2020 11:12 AM  [name] [name]  has presented today for surgery, with the diagnosis of heart failure.  The various methods of treatment have been discussed with the patient and family. After consideration of risks, benefits and other options for treatment, the patient has consented to  Procedure(s): RIGHT HEART CATH (N/A) as a surgical intervention.  The patient's history has been reviewed, patient examined, no change in status, stable for surgery.  I have reviewed the patient's chart and labs.  Questions were answered to the patient's satisfaction.     Gerome Kokesh [employer]

## 2020-05-05 ENCOUNTER — Encounter (HOSPITAL_COMMUNITY): Payer: Self-pay | Admitting: Cardiology

## 2020-05-11 ENCOUNTER — Other Ambulatory Visit (HOSPITAL_COMMUNITY): Payer: Self-pay | Admitting: *Deleted

## 2020-05-11 MED ORDER — RAMIPRIL 5 MG PO CAPS: 5.0000 mg | ORAL_CAPSULE | Freq: Every day | ORAL | 3 refills | Status: DC

## 2020-05-11 MED ORDER — NIFEDIPINE ER OSMOTIC RELEASE 60 MG PO TB24: 120.0000 mg | ORAL_TABLET | Freq: Every day | ORAL | 3 refills | Status: DC

## 2020-05-13 ENCOUNTER — Telehealth (HOSPITAL_COMMUNITY): Payer: Self-pay | Admitting: Emergency Medicine

## 2020-05-13 NOTE — Telephone Encounter (Signed)
Reaching out to patient to offer assistance regarding upcoming cardiac imaging study; pt verbalizes understanding of appt date/time, parking situation and where to check in, pre-test NPO status and medications ordered, and verified current allergies; name and call back number provided for further questions should they arise Katheen Aslin RN Navigator Cardiac [facility] [phone] office [phone] cell 

## 2020-05-16 ENCOUNTER — Other Ambulatory Visit: Payer: Self-pay

## 2020-05-16 ENCOUNTER — Ambulatory Visit (HOSPITAL_COMMUNITY)
Admission: RE | Admit: 2020-05-16 | Discharge: 2020-05-16 | Disposition: A | Discharge status: Home / Self Care | Payer: PRIVATE HEALTH INSURANCE | Source: Ambulatory Visit | Attending: Cardiology | Admitting: Cardiology

## 2020-05-16 DIAGNOSIS — I4891 Unspecified atrial fibrillation: Secondary | ICD-10-CM | POA: Diagnosis not present

## 2020-05-16 MED ORDER — IOHEXOL 350 MG/ML SOLN
80.0000 mL | Freq: Once | INTRAVENOUS | Status: AC | PRN
  Administered 2020-05-16: 80 mL via INTRAVENOUS

## 2020-05-17 ENCOUNTER — Telehealth (HOSPITAL_COMMUNITY): Payer: Self-pay | Admitting: *Deleted

## 2020-05-17 DIAGNOSIS — I2510 Atherosclerotic heart disease of native coronary artery without angina pectoris: Secondary | ICD-10-CM

## 2020-05-17 MED ORDER — METOPROLOL SUCCINATE ER 25 MG PO TB24
12.5000 mg | ORAL_TABLET | Freq: Every day | ORAL | 6 refills | Status: DC
Start: 2020-05-17 — End: 2020-08-30

## 2020-05-17 NOTE — Telephone Encounter (Signed)
Orders placed, med list updated, spoke w/pt he is aware and verbalized understanding, he is aware [facility] will call to sch ETT myoview

## 2020-05-17 NOTE — Telephone Encounter (Signed)
-----   Message from Larey Dresser, MD sent at 05/17/2020  3:39 PM EDT ----- Discussed with patient. Needs to decrease Toprol XL to 12.5 mg daily.  He will need ETT-Cardiolite to assess for ischemia, do not take Toprol XL day of Cardiolite.  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

## 2020-05-25 ENCOUNTER — Telehealth (HOSPITAL_COMMUNITY): Payer: Self-pay

## 2020-05-25 DIAGNOSIS — I480 Paroxysmal atrial fibrillation: Secondary | ICD-10-CM

## 2020-05-25 DIAGNOSIS — I272 Pulmonary hypertension, unspecified: Secondary | ICD-10-CM

## 2020-05-25 DIAGNOSIS — I4891 Unspecified atrial fibrillation: Secondary | ICD-10-CM

## 2020-05-25 NOTE — Telephone Encounter (Signed)
-----   Message from Larey Dresser, MD sent at 05/23/2020  2:00 PM EDT ----- Please let Mr Whichard that I spoke with Dr. Rayann Heman, our atrial fibrillation ablation expert here and would like Mr Welz to have an appointment with Dr. Rayann Heman.  Dr. Rayann Heman is willing to see him, would be able to help direct him to the correct place if pulmonary vein stenting is needed.

## 2020-05-25 NOTE — Telephone Encounter (Signed)
Patient advised and verbalized understanding. Patient is willing to see dr. Rayann Heman. Referral placed to his office and I cc'd Dr. Rayann Heman on this as well.   Orders Placed This Encounter  Procedures  . Ambulatory referral to Cardiology    Referral Priority:   Routine    Referral Type:   Consultation    Referral Reason:   Specialty Services Required    Referred to Provider:   Thompson Grayer, MD    Requested Specialty:   Cardiology    Number of Visits Requested:   1

## 2020-05-26 ENCOUNTER — Ambulatory Visit (INDEPENDENT_AMBULATORY_CARE_PROVIDER_SITE_OTHER): Payer: PRIVATE HEALTH INSURANCE | Admitting: Internal Medicine

## 2020-05-26 ENCOUNTER — Other Ambulatory Visit: Payer: Self-pay

## 2020-05-26 ENCOUNTER — Telehealth (HOSPITAL_COMMUNITY): Payer: Self-pay

## 2020-05-26 ENCOUNTER — Encounter: Payer: Self-pay | Admitting: Internal Medicine

## 2020-05-26 VITALS — BP 130/72 | HR 79 | Ht 78.0 in | Wt 208.0 lb

## 2020-05-26 DIAGNOSIS — G4733 Obstructive sleep apnea (adult) (pediatric): Secondary | ICD-10-CM

## 2020-05-26 DIAGNOSIS — I10 Essential (primary) hypertension: Secondary | ICD-10-CM | POA: Diagnosis not present

## 2020-05-26 DIAGNOSIS — S25402A Unspecified injury of left pulmonary blood vessels, initial encounter: Secondary | ICD-10-CM

## 2020-05-26 DIAGNOSIS — I480 Paroxysmal atrial fibrillation: Secondary | ICD-10-CM | POA: Diagnosis not present

## 2020-05-26 NOTE — Patient Instructions (Addendum)
Medication Instructions:  Your physician recommends that you continue on your current medications as directed. Please refer to the Current Medication list given to you today.  Labwork: None ordered.  Testing/Procedures: None ordered.  Follow-Up: Your physician wants you to follow-up in: 4 months with Dr. Rayann Heman.    October 03, 2020 at 9:30 am   Any Other Special Instructions Will Be Listed Below (If Applicable).  If you need a refill on your cardiac medications before your next appointment, please call your pharmacy.

## 2020-05-26 NOTE — Progress Notes (Signed)
Electrophysiology Office Note   Date:  05/26/2020   ID:  [name] [name], DOB [birth year], MRN [id number]  PCP:  Josetta Huddle, MD  Cardiologist:  Dr Aundra Dubin Primary Electrophysiologist: Thompson Grayer, MD    CC: PV stenosis    History of Present Illness: [name] [name] is a 74 y.o. male who presents today for electrophysiology evaluation.   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 The patient is s/p PVI x 2 in 2009, 2010 by Dr Seabron Spates at [facility].  He did well for quite some time.   He developed recurrent afib and underwent repeat afib ablation 12/2019 at [facility] in [place].  His procedure was initially complicated by TIA.  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  He is doing reasonably well but does have intermittent SOB.  His exercise tolerance appears to be preserved.    Today, he denies symptoms of palpitations, chest pain dizziness, presyncope, syncope, bleeding, or neurologic sequela. The patient is tolerating medications without difficulties and is otherwise without complaint today.    Past Medical History:  Diagnosis Date  . Atrial fibrillation ([name] [name])   . CAD (coronary artery disease)   . Cancer ([name] [name])    SKIN  . Cardiac arrhythmia due to congenital heart disease   . Cataract, nonsenile   . COPD (chronic obstructive pulmonary disease) ([place])   . DJD (degenerative joint disease)   . Finger, superficial foreign body (splinter), without major open wound, infected   . GERD (gastroesophageal reflux disease)   . HTN (hypertension)   . Leukopenia   . Osteoporosis   . Sleep apnea   . TIA (transient ischemic attack) 1990s   transient speech deficit   Past Surgical History:  Procedure Laterality Date  . CATARACT EXTRACTION    . RF ablation for A. Fib '09 - Dr. Rolland Porter, [place]. x 2    . RIGHT HEART CATH N/A 05/04/2020   Procedure: RIGHT HEART CATH;   Surgeon: [name] Dresser, MD;  Location: [facility];  Service: Cardiovascular;  Laterality: N/A;  . TONSILLECTOMY       Current Outpatient Medications  Medication Sig Dispense Refill  . apixaban (ELIQUIS) 5 MG TABS tablet TAKE 1 TABLET(5 MG) BY MOUTH TWICE DAILY 60 tablet 9  . atorvastatin (LIPITOR) 20 MG tablet Take 1 tablet (20 mg total) by mouth daily. 90 tablet 3  . Cholecalciferol (DIALYVITE VITAMIN D 5000) 125 MCG (5000 UT) capsule Take 5,000 Units by mouth daily.    . citalopram (CELEXA) 20 MG tablet Take 20 mg by mouth daily.    . clonazePAM (KLONOPIN) 1 MG tablet Take 1 mg by mouth daily.    . Coenzyme Q10 (COQ10) 100 MG CAPS Take 100 mg by mouth daily.    Marland Kitchen docusate sodium (COLACE) 100 MG capsule Take 200 mg by mouth daily.     Marland Kitchen FIBER PO Take 5 capsules by mouth daily.    . finasteride (PROSCAR) 5 MG tablet Take 5 mg by mouth daily.      . hydrocortisone cream 1 % Apply 1 application topically daily as needed for itching.    . metoprolol succinate (TOPROL-XL) 25 MG 24 hr tablet Take 0.5 tablets (12.5 mg total) by mouth daily. Take with or immediately following a meal. 15 tablet 6  . Multiple Vitamin (MULTIVITAMIN WITH MINERALS) TABS tablet Take 1 tablet  by mouth daily.    Marland Kitchen NIFEdipine (PROCARDIA XL/NIFEDICAL XL) 60 MG 24 hr tablet Take 2 tablets (120 mg total) by mouth daily. 180 tablet 3  . polyethylene glycol powder (GLYCOLAX/MIRALAX) 17 GM/SCOOP powder Take 17 g by mouth daily.     . Psyllium (METAMUCIL PO) Take 1 Scoop by mouth daily.    . ramipril (ALTACE) 5 MG capsule Take 1 capsule (5 mg total) by mouth daily. 90 capsule 3  . tamsulosin (FLOMAX) 0.4 MG CAPS capsule Take 0.4 mg by mouth daily.   11  . triamcinolone cream (KENALOG) 0.1 % Apply 1 application topically daily as needed (rash).     No current facility-administered medications for this visit.    Allergies:   Patient has no known allergies.   Social History:  The patient  reports that he has never  smoked. He has never used smokeless tobacco. He reports current alcohol use of about 2.0 - 4.0 standard drinks of alcohol per week. He reports that he does not use drugs.   Family History:  The patient's  family history includes Colon cancer in an other family member; Coronary artery disease in his father and mother; Heart attack in his father; Stroke in his mother; Transient ischemic attack in his mother.    ROS:  Please see the history of present illness.   All other systems are personally reviewed and negative.    PHYSICAL EXAM: VS:  BP 130/72   Pulse 79   Ht 6\' 6"  (1.981 m)   Wt 208 lb (94.3 kg)   SpO2 93%   BMI 24.04 kg/m  , BMI Body mass index is 24.04 kg/m. GEN: Well nourished, well developed, in no acute distress HEENT: normal Neck: no JVD, carotid bruits, or masses Cardiac: RRR; no murmurs, rubs, or gallops,no edema  Respiratory:  clear to auscultation bilaterally, normal work of breathing GI: soft, nontender, nondistended, + BS MS: no deformity or atrophy Skin: warm and dry  Neuro:  Strength and sensation are intact Psych: euthymic mood, full affect    Recent Labs: 04/26/2020: B Natriuretic Peptide 36.9; BUN 12; Creatinine, Ser 0.75; Platelets 196 05/04/2020: Hemoglobin 15.0; Hemoglobin 15.6; Potassium 3.8; Potassium 4.1; Sodium 139; Sodium 136  personally reviewed   Lipid Panel     Component Value Date/Time   CHOL 146 04/26/2020 1056   CHOL 128 12/24/2017 0851   TRIG 39 04/26/2020 1056   HDL 65 04/26/2020 1056   HDL 58 12/24/2017 0851   CHOLHDL 2.2 04/26/2020 1056   VLDL 8 04/26/2020 1056   LDLCALC 73 04/26/2020 1056   LDLCALC 62 12/24/2017 0851   personally reviewed   Wt Readings from Last 3 Encounters:  05/26/20 208 lb (94.3 kg)  05/04/20 212 lb (96.2 kg)  04/26/20 213 lb 3.2 oz (96.7 kg)      Other studies personally reviewed: Additional studies/ records that were reviewed today include: Dr [name] [name] recors  Review of the above records today  demonstrates: as above  ekg today reveals sinus rhythm, PR 260 msec,  PVC  ASSESSMENT AND PLAN:  1.  PV stenosis I have reviewed CT with both Drs Aundra Dubin and also Dr Weber Cooks. The patient appears to have some symptoms, though I believe that they are mild. I would advise that we proceed with lung perfusion scan to further evaluate his pulmonary involvement.  If this is small, then perhaps conservative measures would be best. He is planning to go to [facility] for additional evaluation.  2. afib Currently controlled/  asymptomatic Continue eliquis  3. HTN Stable No change required today  4. OSA Uses CPAP  Risks, benefits and potential toxicities for medications prescribed and/or refilled reviewed with patient today.    Follow-up:  4 months with me  Current medicines are reviewed at length with the patient today.   The patient does not have concerns regarding his medicines.  The following changes were made today:  none  Labs/ tests ordered today include:  No orders of the defined types were placed in this encounter.    SignedThompson Grayer, MD  05/26/2020 4:21 PM     [place]  [phone] (office) [phone] (fax)

## 2020-05-26 NOTE — Telephone Encounter (Signed)
Spoke with the patient, detailed instructions given, he stated that he understood and would be here for test. Asked to call back with any questions. S.Melonie Germani EMTP

## 2020-05-27 ENCOUNTER — Other Ambulatory Visit (HOSPITAL_COMMUNITY)
Admission: RE | Admit: 2020-05-27 | Discharge: 2020-05-27 | Disposition: A | Discharge status: Home / Self Care | Payer: 59 | Source: Ambulatory Visit | Attending: Cardiology | Admitting: Cardiology

## 2020-05-27 DIAGNOSIS — Z20822 Contact with and (suspected) exposure to covid-19: Secondary | ICD-10-CM | POA: Diagnosis not present

## 2020-05-27 DIAGNOSIS — Z01812 Encounter for preprocedural laboratory examination: Secondary | ICD-10-CM | POA: Insufficient documentation

## 2020-05-27 LAB — SARS CORONAVIRUS 2 (TAT 6-24 HRS): SARS Coronavirus 2: NEGATIVE

## 2020-05-31 ENCOUNTER — Other Ambulatory Visit: Payer: Self-pay

## 2020-05-31 ENCOUNTER — Ambulatory Visit (HOSPITAL_COMMUNITY): Payer: 59 | Attending: Cardiovascular Disease

## 2020-05-31 ENCOUNTER — Telehealth (HOSPITAL_COMMUNITY): Payer: Self-pay

## 2020-05-31 DIAGNOSIS — I2510 Atherosclerotic heart disease of native coronary artery without angina pectoris: Secondary | ICD-10-CM | POA: Insufficient documentation

## 2020-05-31 LAB — MYOCARDIAL PERFUSION IMAGING
Estimated workload: 7 METS
Exercise duration (min): 6 min
LV dias vol: 116 mL (ref 62–150)
LV sys vol: 36 mL
MPHR: 146 {beats}/min
Peak HR: 126 {beats}/min
Percent HR: 86 %
RPE: 18
Rest HR: 77 {beats}/min
SDS: 0
SRS: 0
SSS: 0
TID: 1.03

## 2020-05-31 MED ORDER — TECHNETIUM TC 99M TETROFOSMIN IV KIT
31.7000 | PACK | Freq: Once | INTRAVENOUS | Status: AC | PRN
  Administered 2020-05-31: 31.7 via INTRAVENOUS
  Filled 2020-05-31: qty 32

## 2020-05-31 MED ORDER — TECHNETIUM TC 99M TETROFOSMIN IV KIT
10.1000 | PACK | Freq: Once | INTRAVENOUS | Status: AC | PRN
  Administered 2020-05-31: 10.1 via INTRAVENOUS
  Filled 2020-05-31: qty 11

## 2020-05-31 NOTE — Telephone Encounter (Signed)
Malena Edman, RN  05/31/2020 4:35 PM EDT Back to Top    Pt advised and verbalized understanding

## 2020-05-31 NOTE — Addendum Note (Signed)
Addended by: Rose Phi on: 05/31/2020 04:48 PM   Modules accepted: Orders

## 2020-06-05 ENCOUNTER — Telehealth: Payer: Self-pay | Admitting: Physician Assistant

## 2020-06-05 ENCOUNTER — Emergency Department (HOSPITAL_COMMUNITY): Payer: PRIVATE HEALTH INSURANCE

## 2020-06-05 ENCOUNTER — Other Ambulatory Visit: Payer: Self-pay

## 2020-06-05 ENCOUNTER — Emergency Department (HOSPITAL_COMMUNITY)
Admission: EM | Admit: 2020-06-05 | Discharge: 2020-06-06 | Disposition: A | Discharge status: Home / Self Care | Payer: PRIVATE HEALTH INSURANCE | Attending: Emergency Medicine | Admitting: Emergency Medicine

## 2020-06-05 ENCOUNTER — Encounter (HOSPITAL_COMMUNITY): Payer: Self-pay

## 2020-06-05 DIAGNOSIS — I2510 Atherosclerotic heart disease of native coronary artery without angina pectoris: Secondary | ICD-10-CM | POA: Insufficient documentation

## 2020-06-05 DIAGNOSIS — E871 Hypo-osmolality and hyponatremia: Secondary | ICD-10-CM | POA: Diagnosis not present

## 2020-06-05 DIAGNOSIS — R0602 Shortness of breath: Secondary | ICD-10-CM | POA: Diagnosis not present

## 2020-06-05 DIAGNOSIS — C4490 Unspecified malignant neoplasm of skin, unspecified: Secondary | ICD-10-CM | POA: Insufficient documentation

## 2020-06-05 DIAGNOSIS — J449 Chronic obstructive pulmonary disease, unspecified: Secondary | ICD-10-CM | POA: Insufficient documentation

## 2020-06-05 DIAGNOSIS — Z79899 Other long term (current) drug therapy: Secondary | ICD-10-CM | POA: Diagnosis not present

## 2020-06-05 DIAGNOSIS — I10 Essential (primary) hypertension: Secondary | ICD-10-CM | POA: Insufficient documentation

## 2020-06-05 LAB — CBC
HCT: 46.9 % (ref 39.0–52.0)
Hemoglobin: 15.5 g/dL (ref 13.0–17.0)
MCH: 30.3 pg (ref 26.0–34.0)
MCHC: 33 g/dL (ref 30.0–36.0)
MCV: 91.6 fL (ref 80.0–100.0)
Platelets: 235 10*3/uL (ref 150–400)
RBC: 5.12 MIL/uL (ref 4.22–5.81)
RDW: 12.6 % (ref 11.5–15.5)
WBC: 4.4 10*3/uL (ref 4.0–10.5)
nRBC: 0 % (ref 0.0–0.2)

## 2020-06-05 LAB — BASIC METABOLIC PANEL
Anion gap: 10 (ref 5–15)
BUN: 13 mg/dL (ref 8–23)
CO2: 22 mmol/L (ref 22–32)
Calcium: 9.1 mg/dL (ref 8.9–10.3)
Chloride: 97 mmol/L — ABNORMAL LOW (ref 98–111)
Creatinine, Ser: 0.87 mg/dL (ref 0.61–1.24)
GFR calc Af Amer: >60 mL/min (ref >60)
GFR calc non Af Amer: >60 mL/min (ref >60)
Glucose, Bld: 117 mg/dL — ABNORMAL HIGH (ref 70–99)
Potassium: 4.1 mmol/L (ref 3.5–5.1)
Sodium: 129 mmol/L — ABNORMAL LOW (ref 135–145)

## 2020-06-05 LAB — TROPONIN I (HIGH SENSITIVITY): Troponin I (High Sensitivity): 7 ng/L (ref <18)

## 2020-06-05 LAB — PROTIME-INR
INR: 1 (ref 0.8–1.2)
Prothrombin Time: 12.8 seconds (ref 11.4–15.2)

## 2020-06-05 NOTE — ED Provider Notes (Signed)
[facility] Provider Note   CSN: [id number] Arrival date & time: 06/05/20  1619     History Chief Complaint  Patient presents with  . Shortness of Breath    [name] [name] is a 74 y.o. male with a history of afib on Eliquis, CAD, COPD, hypertension, & sleep apnea who presents to the ED for evaluation of worsening intermittent dyspnea over the past 1 week. Patient states he has had problems with dyspnea intermittently S/p an ablation procedure in April of this year, acutely worsened over the past month or so. Seen by cardiologist Dr. Aundra Dubin and subsequently Dr. Rayann Heman, has had right heart cath, CT cardiac morphology w/ pulmonary veins, and myo perfusion exercise stress test within the past 1.5 months, ultimately patient made an appointment at [facility] for further evaluation and management. He states that over the past few days he has felt more short of breath, primarily with activity, rarely at rest, as he has over the past couple of weeks, however seems more prominent. Reports mild brief intermittent chest pain to R chest that is brief, last episode was a few minutes last night. Denies lightheadedness, dizziness, syncope, nausea, vomiting, diaphoresis,  leg pain/swelling, hemoptysis, recent surgery/trauma, recent long travel, hormone use, personal hx of cancer, or hx of DVT/PE.   HPI     Past Medical History:  Diagnosis Date  . Atrial fibrillation ([place])   . CAD (coronary artery disease)   . Cancer ([place])    SKIN  . Cardiac arrhythmia due to congenital heart disease   . Cataract, nonsenile   . COPD (chronic obstructive pulmonary disease) ([place])   . DJD (degenerative joint disease)   . Finger, superficial foreign body (splinter), without major open wound, infected   . GERD (gastroesophageal reflux disease)   . HTN (hypertension)   . Leukopenia   . Osteoporosis   . Sleep apnea   . TIA (transient ischemic attack) 1990s   transient speech deficit     Patient Active Problem List   Diagnosis Date Noted  . Advanced care planning/counseling discussion 11/10/2013  . Health care maintenance 11/10/2013  . Encounter for therapeutic drug monitoring 11/04/2013  . Leg length inequality 12/02/2012  . Left elbow pain 12/02/2012  . Abnormal gait 08/20/2011  . HYPERLIPIDEMIA-MIXED 10/31/2009  . SLEEP APNEA, OBSTRUCTIVE, MILD 12/04/2007  . ERECTILE DYSFUNCTION 11/28/2007  . BENIGN PROSTATIC HYPERTROPHY, HX OF 11/28/2007  . LEUKOPENIA, CHRONIC 10/21/2007  . HYPERTENSION 10/21/2007  . CORONARY ARTERY DISEASE 10/21/2007  . COPD 10/21/2007  . GERD 10/21/2007  . DEGENERATIVE JOINT DISEASE, BOTH KNEES, SEVERE 10/21/2007  . NUCLEAR CATARACT, NONSENILE 10/17/2007  . Atrial fibrillation ([place]) 10/17/2007  . Osteopenia 10/17/2007  . HIATAL HERNIA 05/11/2001    Past Surgical History:  Procedure Laterality Date  . CATARACT EXTRACTION    . RF ablation for A. Fib '09 - Dr. Rolland Porter, [place]. x 2    . RIGHT HEART CATH N/A 05/04/2020   Procedure: RIGHT HEART CATH;  Surgeon: Larey Dresser, MD;  Location: [facility];  Service: Cardiovascular;  Laterality: N/A;  . TONSILLECTOMY         Family History  Problem Relation Age of Onset  . Heart attack Father   . Coronary artery disease Father   . Transient ischemic attack Mother   . Coronary artery disease Mother        h/o TIA   . Stroke Mother   . Colon cancer Other  aunt and uncle    Social History   Tobacco Use  . Smoking status: Never Smoker  . Smokeless tobacco: Never Used  Substance Use Topics  . Alcohol use: Yes    Alcohol/week: 2.0 - 4.0 standard drinks    Types: 1 - 2 Cans of beer, 1 - 2 Shots of liquor per week  . Drug use: No    Home Medications Prior to Admission medications   Medication Sig Start Date End Date Taking? Authorizing Provider  apixaban (ELIQUIS) 5 MG TABS tablet TAKE 1 TABLET(5 MG) BY MOUTH TWICE DAILY 11/17/19   Larey Dresser, MD  atorvastatin  (LIPITOR) 20 MG tablet Take 1 tablet (20 mg total) by mouth daily. 04/28/20   Larey Dresser, MD  Cholecalciferol (DIALYVITE VITAMIN D 5000) 125 MCG (5000 UT) capsule Take 5,000 Units by mouth daily.    [provider]  citalopram (CELEXA) 20 MG tablet Take 20 mg by mouth daily.    [provider]  clonazePAM (KLONOPIN) 1 MG tablet Take 1 mg by mouth daily.    [provider]  Coenzyme Q10 (COQ10) 100 MG CAPS Take 100 mg by mouth daily.    [provider]  docusate sodium (COLACE) 100 MG capsule Take 200 mg by mouth daily.     [provider]  FIBER PO Take 5 capsules by mouth daily.    [provider]  finasteride (PROSCAR) 5 MG tablet Take 5 mg by mouth daily.      [provider]  hydrocortisone cream 1 % Apply 1 application topically daily as needed for itching.    [provider]  metoprolol succinate (TOPROL-XL) 25 MG 24 hr tablet Take 0.5 tablets (12.5 mg total) by mouth daily. Take with or immediately following a meal. 05/17/20   Larey Dresser, MD  Multiple Vitamin (MULTIVITAMIN WITH MINERALS) TABS tablet Take 1 tablet by mouth daily.    [provider]  NIFEdipine (PROCARDIA XL/NIFEDICAL XL) 60 MG 24 hr tablet Take 2 tablets (120 mg total) by mouth daily. 05/11/20   Larey Dresser, MD  polyethylene glycol powder [facility]) 17 GM/SCOOP powder Take 17 g by mouth daily.     [provider]  Psyllium (METAMUCIL PO) Take 1 Scoop by mouth daily.    [provider]  ramipril (ALTACE) 5 MG capsule Take 1 capsule (5 mg total) by mouth daily. 05/11/20 08/09/20  Larey Dresser, MD  tamsulosin (FLOMAX) 0.4 MG CAPS capsule Take 0.4 mg by mouth daily.  07/10/18   [provider]  triamcinolone cream (KENALOG) 0.1 % Apply 1 application topically daily as needed (rash).    [provider]    Allergies    Patient has no known allergies.  Review of Systems   Review of Systems   Constitutional: Negative for chills, diaphoresis and fever.  Respiratory: Positive for shortness of breath. Negative for cough.   Cardiovascular: Positive for chest pain (not at present).  Gastrointestinal: Negative for abdominal pain, nausea and vomiting.  Neurological: Negative for syncope.  All other systems reviewed and are negative.  Physical Exam Updated Vital Signs BP 129/68 (BP Location: Left Arm)   Pulse 90   Temp (!) 97.5 F (36.4 C) (Oral)   Resp 18   SpO2 97%   Physical Exam Vitals and nursing note reviewed.  Constitutional:      General: He is not in acute distress.    Appearance: He is well-developed. He is not toxic-appearing.  HENT:     Head: Normocephalic and atraumatic.  Eyes:     General:        Right eye: No discharge.        Left eye: No discharge.     Conjunctiva/sclera: Conjunctivae normal.  Cardiovascular:     Rate and Rhythm: Normal rate and regular rhythm.  Pulmonary:     Effort: Pulmonary effort is normal. No respiratory distress.     Breath sounds: Normal breath sounds. No wheezing, rhonchi or rales.  Abdominal:     General: There is no distension.     Palpations: Abdomen is soft.     Tenderness: There is no abdominal tenderness.  Musculoskeletal:     Cervical back: Neck supple.     Right lower leg: No tenderness. No edema.     Left lower leg: No tenderness. No edema.  Skin:    General: Skin is warm and dry.     Findings: No rash.  Neurological:     Mental Status: He is alert.     Comments: Clear speech.   Psychiatric:        Behavior: Behavior normal.    ED Results / Procedures / Treatments   Labs (all labs ordered are listed, but only abnormal results are displayed) Labs Reviewed  BASIC METABOLIC PANEL - Abnormal; Notable for the following components:      Result Value   Sodium 129 (*)    Chloride 97 (*)    Glucose, Bld 117 (*)    All other components within normal limits  CBC  PROTIME-INR  TROPONIN I (HIGH SENSITIVITY)    TROPONIN I (HIGH SENSITIVITY)    EKG EKG Interpretation  Date/Time:  Sunday June 05 2020 17:10:47 EDT Ventricular Rate:  83 PR Interval:  242 QRS Duration: 86 QT Interval:  378 QTC Calculation: 444 R Axis:   80 Text Interpretation: Sinus rhythm with sinus arrhythmia with 1st degree A-V block Nonspecific ST abnormality Abnormal ECG No significant change since last tracing Confirmed by Isla Pence [phone]) on 06/05/2020 8:58:04 PM   Radiology DG Chest 2 View  Result Date: 06/05/2020 CLINICAL DATA:  Shortness of breath. EXAM: CHEST - 2 VIEW COMPARISON:  Chest CT from 2007 FINDINGS: The cardiac silhouette, mediastinal and hilar contours are within normal limits. There is mild tortuosity and calcification of the thoracic aorta. Hyperinflation and emphysematous changes but no infiltrates, edema or effusions. No worrisome pulmonary lesions. The bony thorax is intact. IMPRESSION: Emphysematous changes but no acute overlying pulmonary process. Electronically Signed   By: Marijo Sanes M.D.   On: 06/05/2020 17:51    Procedures Procedures (including critical care time)  Medications Ordered in ED Medications - No data to display  ED Course  I have reviewed the triage vital signs and the nursing notes.  Pertinent labs & imaging results that were available during my care of the patient were reviewed by me and considered in my medical decision making (see chart for details).    MDM Rules/Calculators/A&P                         Patient presents to the ED with complaints of dyspnea that has been worsening recently, occurring intermittently since 12/2019- being followed by cardiology for this. Nontoxic, vitals WNL. Exam benign.  Additional history obtained:  Additional history obtained from chart & nursing note review. Previous records obtained and reviewed:   R heart cath performed 05/04/20: normal right sided filling pressures, moderate  pulmonary hypertension, primarily pulmonary  venous hypertension, preserved cardiac output. 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 Myo perfusion stress testing 05/31/20: Normal perfusion w/o ischemia, low risk study--> per cardiology would not pursue coronary angiography at this time.   EKG: No significant change since last tracing.   Lab Tests:  I reviewed, and interpreted labs, which included:  CBC: No anemia/leukocytosis.  BMP: Mild hyponatremia/chloremia- unclear definitive etiology, did recently start celexa recently- his PCP his currently tapering this medication for him to stop it- discussed need for close follow up with PCP for recheck.  Troponin: WNL   Imaging Studies ordered:  CXR per triage, I independently visualized and interpreted imaging which showed Emphysematous changes but no acute overlying pulmonary process.  Low risk wells, anticoagulated, doubt PE.  No pneumonia, pneumothorax, or acute CHF on CXR.  EKG without significant change compared to prior,  troponin WNL w/ last episode of chest pain> 24 hours ago, recent reassuring stress test during which it was felt patient did not need further eval with coronary angiography at that time- low suspicion for ACS currently. Ambulatory SpO2 93-95% on RA without signs of respiratory distress at this time. Will discuss w/ cardiology.   23:27: CONSULT: Discussed patient presentation/workup with Dr. Blossom Hoops on call with cardiology who has reviewed patients prior work-up, okay for discharge with follow up with [facility] for perfusion study as planned. Appreciate consultation.   I discussed results, treatment plan, need for follow-up, and return  precautions with the patient. Provided opportunity for questions, patient confirmed understanding and is in agreement with plan.   This is a shared visit with supervising physician Dr. Gilford Raid who has independently evaluated patient & provided guidance in evaluation/management/disposition, in agreement with care   Portions of this note were generated with Dragon dictation software. Dictation errors may occur despite best attempts at proofreading.  Final Clinical Impression(s) / ED Diagnoses Final diagnoses:  SOB (shortness of breath)  Hyponatremia    Rx / DC Orders ED Discharge Orders    None       Leafy Kindle 06/05/20 2336    Isla Pence, MD 06/09/20 1622

## 2020-06-05 NOTE — ED Notes (Signed)
Pt ambulated in the room with pulse oximetry on finger. Pt was on room and saturations remained at 93-95% and ambulated with a steady gait.

## 2020-06-05 NOTE — Telephone Encounter (Signed)
Paged by answering service. Patient reported progressive worsening dyspnea on exertion. He reports with activity his oxygen is sometimes dropping to 80s. Had mild chest discomfort last night but this morning no reoccurrence. Denies orthopnea, PND, syncope, lower extremity edema or melena. No active symptoms currently but seems progression of his disease. I have advised him to monitor his symptoms and call office in the morning to move up his appointment. If worsening breathing and recurrent chest discomfort seek emergent medical attention. Is agree with plan.

## 2020-06-05 NOTE — ED Triage Notes (Signed)
Patient complains of increasing SOB that he relates to needing stent in pulmonary vein. Has appointment end of month at [facility] for the stent placement. States even with sitting feels more SOB. Concerned with right anterior CP. Alert and oriented

## 2020-06-05 NOTE — Discharge Instructions (Signed)
You were seen in the emergency department today for worsening shortness of breath.  Your work-up was overall reassuring.  Your chest x-ray did not show pneumonia or significant fluid overload.  Your labs did show that your sodium was a bit lower than usual, this can occur for a variety of reasons, this can be related to medications as well, please have this rechecked by primary care provider within the next 3 days to ensure this is not worsening and to ensure that you do not need an adjustment in your medications as well as to recheck your current symptoms.  Your oxygen saturations have been reassuring in the emergency department as well.  We spoke with your cardiology team who are agreement with continued plan for you to follow-up with [facility] as scheduled.  Please be sure to keep this follow-up appointment, please also follow-up with your cardiology team.   Return to the emergency department for worsening shortness of breath, chest pain, passing out, coughing up blood, fever, or any other concerns.

## 2020-06-06 ENCOUNTER — Telehealth (HOSPITAL_COMMUNITY): Payer: Self-pay | Admitting: *Deleted

## 2020-06-06 DIAGNOSIS — I272 Pulmonary hypertension, unspecified: Secondary | ICD-10-CM

## 2020-06-06 DIAGNOSIS — Q268 Other congenital malformations of great veins: Secondary | ICD-10-CM

## 2020-06-06 NOTE — Telephone Encounter (Signed)
VQ scan sch for 9/15, pt is aware  Anderson Malta, RN in cath lab uploaded pts cath to [facility] so [facility] can view it, pt aware

## 2020-06-06 NOTE — Telephone Encounter (Signed)
-----   Message from Larey Dresser, MD sent at 05/24/2020  4:54 PM EDT ----- Mr Ausborn needs a V/Q scan to assess for abnormal lung perfusion in setting of pulmonary vein stenosis.

## 2020-06-07 ENCOUNTER — Telehealth (HOSPITAL_COMMUNITY): Payer: Self-pay | Admitting: *Deleted

## 2020-06-07 ENCOUNTER — Encounter (HOSPITAL_COMMUNITY): Payer: Self-pay | Admitting: *Deleted

## 2020-06-07 MED ORDER — RAMIPRIL 10 MG PO CAPS: 10.0000 mg | ORAL_CAPSULE | Freq: Every day | ORAL | 3 refills | Status: AC

## 2020-06-07 NOTE — Telephone Encounter (Signed)
[name] [name], can you increase [name] [name]'s ramipril prescription to 10 mg daily and arrange for bmet in 10 days   rx updated, bmet will be checked at f/u appt 9/27, mychart mess sent to pt

## 2020-06-08 ENCOUNTER — Other Ambulatory Visit: Payer: Self-pay

## 2020-06-08 ENCOUNTER — Ambulatory Visit (HOSPITAL_COMMUNITY)
Admission: RE | Admit: 2020-06-08 | Discharge: 2020-06-08 | Disposition: A | Discharge status: Home / Self Care | Payer: 59 | Source: Ambulatory Visit | Attending: Cardiology | Admitting: Cardiology

## 2020-06-08 ENCOUNTER — Telehealth (HOSPITAL_COMMUNITY): Payer: Self-pay

## 2020-06-08 ENCOUNTER — Other Ambulatory Visit (HOSPITAL_COMMUNITY): Payer: Self-pay | Admitting: Cardiology

## 2020-06-08 DIAGNOSIS — I272 Pulmonary hypertension, unspecified: Secondary | ICD-10-CM

## 2020-06-08 DIAGNOSIS — Q268 Other congenital malformations of great veins: Secondary | ICD-10-CM | POA: Diagnosis not present

## 2020-06-08 MED ORDER — TECHNETIUM TO 99M ALBUMIN AGGREGATED
4.4000 | Freq: Once | INTRAVENOUS | Status: AC | PRN
  Administered 2020-06-08: 4.4 via INTRAVENOUS

## 2020-06-08 NOTE — Telephone Encounter (Signed)
-----   Message from Larey Dresser, MD sent at 05/25/2020  2:30 PM EDT ----- I would like to put in a referral for Mr Ehresman to see Dr. Corine Shelter with the [facility] (comes to [facility]) --- NOT Dr. Dr Agustin Cree in our practice in [place]. 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

## 2020-06-08 NOTE — Telephone Encounter (Signed)
Referral placed  Orders Placed This Encounter  Procedures   Ambulatory referral to Cardiology    Referral Priority:   Routine    Referral Type:   Consultation    Referral Reason:   Specialty Services Required    Referred to Provider:   Jonah Blue, MD    Number of Visits Requested:   1

## 2020-06-20 ENCOUNTER — Ambulatory Visit (HOSPITAL_COMMUNITY)
Admission: RE | Admit: 2020-06-20 | Discharge: 2020-06-20 | Disposition: A | Discharge status: Home / Self Care | Payer: BC Managed Care – PPO | Source: Ambulatory Visit | Attending: Cardiology | Admitting: Cardiology

## 2020-06-20 ENCOUNTER — Other Ambulatory Visit: Payer: Self-pay

## 2020-06-20 VITALS — BP 116/62 | HR 102 | Wt 217.4 lb

## 2020-06-20 DIAGNOSIS — Z8673 Personal history of transient ischemic attack (TIA), and cerebral infarction without residual deficits: Secondary | ICD-10-CM | POA: Insufficient documentation

## 2020-06-20 DIAGNOSIS — I480 Paroxysmal atrial fibrillation: Secondary | ICD-10-CM | POA: Insufficient documentation

## 2020-06-20 DIAGNOSIS — K219 Gastro-esophageal reflux disease without esophagitis: Secondary | ICD-10-CM | POA: Diagnosis not present

## 2020-06-20 DIAGNOSIS — I4891 Unspecified atrial fibrillation: Secondary | ICD-10-CM

## 2020-06-20 DIAGNOSIS — R079 Chest pain, unspecified: Secondary | ICD-10-CM | POA: Diagnosis not present

## 2020-06-20 DIAGNOSIS — I272 Pulmonary hypertension, unspecified: Secondary | ICD-10-CM

## 2020-06-20 DIAGNOSIS — Z7901 Long term (current) use of anticoagulants: Secondary | ICD-10-CM | POA: Diagnosis not present

## 2020-06-20 DIAGNOSIS — I2510 Atherosclerotic heart disease of native coronary artery without angina pectoris: Secondary | ICD-10-CM

## 2020-06-20 DIAGNOSIS — G4733 Obstructive sleep apnea (adult) (pediatric): Secondary | ICD-10-CM | POA: Insufficient documentation

## 2020-06-20 DIAGNOSIS — E785 Hyperlipidemia, unspecified: Secondary | ICD-10-CM | POA: Diagnosis not present

## 2020-06-20 DIAGNOSIS — R0609 Other forms of dyspnea: Secondary | ICD-10-CM | POA: Diagnosis not present

## 2020-06-20 DIAGNOSIS — Z8249 Family history of ischemic heart disease and other diseases of the circulatory system: Secondary | ICD-10-CM | POA: Insufficient documentation

## 2020-06-20 DIAGNOSIS — I10 Essential (primary) hypertension: Secondary | ICD-10-CM | POA: Diagnosis not present

## 2020-06-20 DIAGNOSIS — Z79899 Other long term (current) drug therapy: Secondary | ICD-10-CM | POA: Insufficient documentation

## 2020-06-20 DIAGNOSIS — R0602 Shortness of breath: Secondary | ICD-10-CM | POA: Insufficient documentation

## 2020-06-20 DIAGNOSIS — Q268 Other congenital malformations of great veins: Secondary | ICD-10-CM

## 2020-06-20 LAB — TSH: TSH: 1.411 u[IU]/mL (ref 0.350–4.500)

## 2020-06-20 NOTE — Patient Instructions (Signed)
Labs done today, your results will be available in MyChart, we will contact you for abnormal readings.  Your physician recommends that you return for a FASTING lipid profile: 1 month  Your physician recommends that you schedule a follow-up appointment in: 2 months  If you have any questions or concerns before your next appointment please send [place] a message through [place] or call our office at [phone].    TO LEAVE A MESSAGE FOR THE NURSE SELECT OPTION 2, PLEASE LEAVE A MESSAGE INCLUDING: . YOUR NAME . DATE OF BIRTH . CALL BACK NUMBER . REASON FOR CALL**this is important as we prioritize the call backs  [place] AS LONG AS YOU CALL BEFORE 4:00 PM  At the [facility], you and your health needs are our priority. As part of our continuing mission to provide you with exceptional heart care, we have created designated Provider Care Teams. These Care Teams include your primary Cardiologist (physician) and Advanced Practice Providers (APPs- Physician Assistants and Nurse Practitioners) who all work together to provide you with the care you need, when you need it.   You may see any of the following providers on your designated Care Team at your next follow up: Marland Kitchen Dr Glori Bickers . Dr Loralie Champagne . Darrick Grinder, NP . Lyda Jester, PA . Audry Riles, PharmD   Please be sure to bring in all your medications bottles to every appointment.

## 2020-06-21 NOTE — Progress Notes (Signed)
PCP: Josetta Huddle, MD Cardiology: Dr. Aundra Dubin EP: Dr. Caryl Comes  74 y.o. with history of nonobstructive CAD and paroxysmal atrial fibrillation returns for followup of pulmonary hypertension.  Patient had ablations for atrial fibrillation in 2009 and 2010. He has OSA and uses CPAP regularly.  He had a cath in 2007 with nonobstructive CAD and a Cardiolite in 2017 without ischemia.    In 5/19, he was in [place], [place].  He was on a treadmill doing his usual routine when he began to feel "funny."  He had an odd feeling in his abdomen.  He lost consciousness and fell to the ground.  He was not unconscious for very long.  He was taken to the [facility].  While there, he had a very extensive workup.  Echo showed pulmonary hypertension with signs of RV failure.  V/Q scan showed no chronic PE and CT chest did not show significant abnormalities.  RHC was done, this showed pulmonary hypertension that appeared vasoreactive with NO testing (see PMH section below for numbers).  He was started on nifedipine XR.  This has been titrated up to 120 mg daily.  He had an echo again in 7/19 at the [facility], this showed moderate RV dilation, borderline decreased RV function.  RVSP was down to 54 mmHg from 74 mmHg.   He had additional episodes of atrial fibrillation in 2021, and ended up having repeat atrial fibrillation ablation in 4/21 at the [facility] in [place].  He had a TIA post-procedure.  Pre-procedure, CT for pulmonary veins showed mild PV narrowing, especially of the left superior pulmonary vein.   Last echo was in 6/21 at the [facility], this showed EF 65%, moderate RV enlargement, mildly decreased RV systolic function, severe RAE, moderate PI, normal IVC (unable to estimate PASP).    He has had episodes of atypical atrial flutter since last ablation, NSR today.   Since the last ablation, he has been more short of breath with exertion.  He clearly dates this to after the ablation. RHC was done, this  showed mild-moderate pulmonary hypertension but was most suggestive of pulmonary venous hypertension with mildly elevated PCWP.  Interestingly, there were large v-waves in the PCWP tracing from the left side compared to the right side.  There was no step up in oxygen saturation from SVC=>PA, no evidence for left to right shunting. Due to concern for pulmonary vein stenosis (had mild left superior PV stenosis pre-ablation), we did a CT pulmonary vein protocol.  This was concerning for moderate left superior PV stenosis with significant ostial calcification.  Other PVs did not appear significantly stenotic.  V/Q scan was suggestive of decreased flow left lung apex.  His coronaries were also heavily calcified on CT PV protocol, unable to comment on degree of stenosis in LAD or RCA due to blooming artifact.  Therefore, he had a ETT-Cardiolite done. This showed no evidence for ischemia or infarction.   He returns for followup today.  He has not felt palpitations.  Still feels like he is more short of breath than prior to ablation.  He is short of breath walking up stairs fast and carrying a heavy load.  Generally ok walking on flat ground.  Uses rowing machine for 30 minutes without much problem.  Generally, seems breathless with moderate to heavy exertion, was not like this a few months ago.  He has occasional atypical, right-sided chest pain.  No exertional chest pain.  He is also very anxious and does note  shortness of breath with talking and when thinking about his medical issues.   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 Atrial fibrillation: Paroxysmal.  S/p ablation in 2009, then repeat ablation  in 2010 at [facility].   - Ablation 4/21 at [facility].  2. TIAs: Initially in 1990s, had another TIA post-ablation in 4/21.  3. GERD 4. HTN 5. Hyperlipidemia 6. BPH 7. OSA: Uses CPAP 8. CAD: LHC in 2007 with nonobstructive disease.  - Cardiolite in 2017 did not show ischemia or infarction.  - CT pulmonary vein protocol (8/21) showed extensive calcification in LAD and RCA, unable to comment on degree of stenosis, CAC 4198 Agatston units (97th percentile).  - ETT-Cardiolite in 9/21: EF 69%, 6' exercise, PVCs and NSVT noted with stress, no ischemia/infarction noted.  9. Pulmonary hypertension:  Diagnosed after syncopal episode on treadmill in 5/19.  - V/Q scan (5/19): No evidence for chronic PE - ANA, TSH, HIV negative (5/19) - CT chest (5/19): No evidence for ILD.  - PFTs (5/19): Mild obstruction.  - Echo (5/19): EF 65-70%, IV septal flattening with moderately enlarged RV with moderately decreased systolic function. PASP 74 mmHg.  - RHC (5/19): Initial RHC with mean RA 4, RV 62/7, PA 68/19 mean 40, PCWP 22, CI 2.45, PVR 3 WU => primarily pulmonary venous hypertension with no evidence for shunt lesion (PA sat 70%, RA sat 69%).  He was then given NTG sprays and RHC was repeated, showing fall in PCWP out of proportion to fall in PCWP and rise in PVR to 3.46 => unmasked PAH.  Next, he was started on inhaled NO and numbers were repeated: mean PA 19, PCWP 9, CI 2.7, PVR 1.5 => correction with NO, so vasoreactive.  - Start on nifedipine CR as vasoreactive.  - Echo (7/19): EF 58%, moderate RV dilation with borderline decreased systolic function, RVSP 54 mmHg.  - Echo (6/21): EF 65%, moderate RV enlargement, mildly decreased RV systolic function, severe RAE, moderate PI, normal IVC (unable to estimate PASP).  - RHC (8/21): mean RA 2, RV 53/4, PA 50/18 mean 33, PCWP mean 17 on right with v wave to 22, PCWP mean 16 on left with v-wave to 34, no evidence for shunt lesion (SVC sat = PA sat), CI 3, PVR 2.44 WU =>  most suggestive of pulmonary venous hypertension.  10. Holter (6/19) with occasional up to 2.8 sec nocturnal pauses.  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  - V/Q scan (9/21): diminished perfusion lung apices greater on left.   SH: Married, lives in [place], 2 daughters, [facility], never smoked.   Family History  Problem Relation Age of Onset  . Heart attack Father   . Coronary artery disease Father   . Transient ischemic attack Mother   . Coronary artery disease Mother        h/o TIA   . Stroke Mother   . Colon cancer Other        aunt and uncle   ROS: All systems reviewed and negative except as per HPI.   Current Outpatient Medications  Medication Sig  Dispense Refill  . apixaban (ELIQUIS) 5 MG TABS tablet TAKE 1 TABLET(5 MG) BY MOUTH TWICE DAILY 60 tablet 9  . atorvastatin (LIPITOR) 20 MG tablet Take 1 tablet (20 mg total) by mouth daily. 90 tablet 3  . Cholecalciferol (DIALYVITE VITAMIN D 5000) 125 MCG (5000 UT) capsule Take 5,000 Units by mouth daily.    . citalopram (CELEXA) 20 MG tablet Take 10 mg by mouth daily.     . Coenzyme Q10 (COQ10) 100 MG CAPS Take 100 mg by mouth daily.    Marland Kitchen docusate sodium (COLACE) 100 MG capsule Take 200 mg by mouth daily.     . famotidine (PEPCID) 40 MG tablet Take 40 mg by mouth every evening.    Marland Kitchen FIBER PO Take 5 capsules by mouth daily.    . finasteride (PROSCAR) 5 MG tablet Take 5 mg by mouth daily.      . hydrocortisone cream 1 % Apply 1 application topically daily as needed for itching.    . metoprolol succinate (TOPROL-XL) 25 MG 24 hr tablet Take 0.5 tablets (12.5 mg total) by mouth daily. Take with or immediately following a meal. 15 tablet 6  . Multiple Vitamin (MULTIVITAMIN WITH MINERALS) TABS tablet Take 1 tablet by mouth daily.    Marland Kitchen NIFEdipine (PROCARDIA XL/NIFEDICAL XL) 60 MG 24 hr tablet Take 2 tablets (120 mg total) by mouth daily. 180 tablet  3  . polyethylene glycol powder (GLYCOLAX/MIRALAX) 17 GM/SCOOP powder Take 17 g by mouth daily as needed for moderate constipation.     . ramipril (ALTACE) 10 MG capsule Take 1 capsule (10 mg total) by mouth daily. 90 capsule 3  . tamsulosin (FLOMAX) 0.4 MG CAPS capsule Take 0.4 mg by mouth daily.   11   No current facility-administered medications for this encounter.   BP 116/62   Pulse (!) 102   Wt 98.6 kg (217 lb 6 oz)   SpO2 95%   BMI 25.12 kg/m  General: NAD Neck: No JVD, no thyromegaly or thyroid nodule.  Lungs: Clear to auscultation bilaterally with normal respiratory effort. CV: Nondisplaced PMI.  Heart regular S1/S2, no S3/S4, no murmur.  No peripheral edema.  No carotid bruit.  Normal pedal pulses.  Abdomen: Soft, nontender, no hepatosplenomegaly, no distention.  Skin: Intact without lesions or rashes.  Neurologic: Alert and oriented x 3.  Psych: Normal affect. Extremities: No clubbing or cyanosis.  HEENT: Normal.   Assessment/Plan: 1. Pulmonary hypertension: Initial workup was done at [facility] in [place].  This may be the cause of his syncopal episode in 5/19. Echo in 5/19 showed elevated PA pressure with RV dysfunction.  V/Q scan and CT chest were negative in 2019.  Serologic workup was negative. Initial RHC looked like pulmonary venous hypertension but NTG spray was able to unmask PAH.  NO challenge in the cath lab led to a significant response with elimination of PH and fall in PVR to 1.5 WU. Therefore, patient was thought to have vasoreactive PH and was started on nifedipine XR 30 mg daily and titrated up. He is now at goal dose of nifedipine, 120 mg daily.  He has OSA but this is treated with CPAP. OSA does not explain the extent of his PH.  Often, CCB responsive PH shows decrement in effect of the CCB treatment over time, requiring the addition of selective pulmonary vasodilators.  Recently, patient has had increased dyspnea (since his afib ablation).  Echo in 6/21 showed  that the RV was still moderately dilated  with mildly decreased systolic function.  RHC in 8/21 looked like mild-moderate primarily pulmonary venous hypertension (mildly elevated PCWP).  No evidence for left=>right shunting. See below, concern for pulmonary vein stenosis post redo PVI in 4/21.   - Continue nifedipine XR 120 mg daily.  - For now, will not additional pulmonary vasodilator until we have full evaluation of PV stenosis.   2. Atrial fibrillation: Paroxysmal.  NSR today.  He is s/p redo ablation in 4/21.   - Continue Eliquis.  - Continue Toprol XL.  3. HTN:  BP controlled on current regimen.  4. OSA: Using CPAP.  5. CAD: Nonobstructive on 2007 cath.  CT PV protocol showed heavy LAD and RCA calcification, unable to comment on degree of stenosis due to blooming artifact.  High risk calcium score.  This was followed up with ETT-Cardiolite that showed no ischemia or infarction.  No worrisome chest pain, has had exertional dyspnea since redo afib ablation.  Cannot fully rule out balance ischemia.  - If it does not appear that his symptoms are coming for pulmonary vein stenosis, will likely need coronary angiography.  Will see how his workup with Dr. Corine Shelter goes before doing this.  - No ASA given apixaban use.  - Continue atorvastatin 20 mg daily, check lipids/LFTs in 1 month.  6. Pulmonary vein stenosis: Patient had mild left superior PV stenosis prior to most recent ablation in 4/21.  He had 2 prior ablations in 2009 and 2010.  Since 4/21 ablation, he has noted new exertional dyspnea with moderate-heavy activity.  CT PV protocol in 8/21, as above, showed moderate left superior PV stenosis.  V/Q scan also showed decreased perfusion left lung apex.  The LSPV ostium is calcified.  Generally, would not have expected significant dyspnea with a single significant PV stenosis, but concerned this could be the case here based on timing of symptoms and V/Q scan findings.  - He has evaluation with Dr.  Corine Shelter at [facility] this week regarding the PV stenosis.  7. Exertional dyspnea: As above, dates since post-AF ablation.  Not markedly severe but distressing.  RHC with pulmonary venous hypertension primarily.  Concern as above for LSPV stenosis.  Also cannot fully rule out obstructive coronary disease (?balanced ischemia on Cardiolite but unlikely).  TSH normal.  Additionally, some of his symptomatology seems related to anxiety as well.  - Plan will be evaluation of PV stenosis by Dr. Corine Shelter, if this is not thought to be the culprit, would proceed with coronary angiography as next step.   Followup 2 months with me.   Loralie Champagne 06/21/2020

## 2020-06-24 ENCOUNTER — Ambulatory Visit: Payer: 59 | Admitting: Internal Medicine

## 2020-07-18 ENCOUNTER — Encounter (HOSPITAL_COMMUNITY): Payer: Self-pay

## 2020-07-18 ENCOUNTER — Ambulatory Visit (HOSPITAL_COMMUNITY)
Admission: RE | Admit: 2020-07-18 | Discharge: 2020-07-18 | Disposition: A | Discharge status: Home / Self Care | Payer: PRIVATE HEALTH INSURANCE | Source: Ambulatory Visit | Attending: Internal Medicine | Admitting: Internal Medicine

## 2020-07-18 ENCOUNTER — Other Ambulatory Visit: Payer: Self-pay

## 2020-07-18 DIAGNOSIS — E785 Hyperlipidemia, unspecified: Secondary | ICD-10-CM

## 2020-07-19 ENCOUNTER — Ambulatory Visit (HOSPITAL_COMMUNITY)
Admission: RE | Admit: 2020-07-19 | Discharge: 2020-07-19 | Disposition: A | Discharge status: Home / Self Care | Payer: 59 | Source: Ambulatory Visit | Attending: Cardiology | Admitting: Cardiology

## 2020-07-19 DIAGNOSIS — I272 Pulmonary hypertension, unspecified: Secondary | ICD-10-CM | POA: Diagnosis not present

## 2020-07-19 LAB — HEPATIC FUNCTION PANEL
ALT: 14 U/L (ref 0–44)
AST: 24 U/L (ref 15–41)
Albumin: 4.3 g/dL (ref 3.5–5.0)
Alkaline Phosphatase: 64 U/L (ref 38–126)
Bilirubin, Direct: 0.2 mg/dL (ref 0.0–0.2)
Indirect Bilirubin: 1 mg/dL — ABNORMAL HIGH (ref 0.3–0.9)
Total Bilirubin: 1.2 mg/dL (ref 0.3–1.2)
Total Protein: 7.2 g/dL (ref 6.5–8.1)

## 2020-07-19 LAB — LIPID PANEL
Cholesterol: 133 mg/dL (ref 0–200)
HDL: 62 mg/dL (ref >40)
LDL Cholesterol: 62 mg/dL (ref 0–99)
Total CHOL/HDL Ratio: 2.1 RATIO
Triglycerides: 44 mg/dL (ref <150)
VLDL: 9 mg/dL (ref 0–40)

## 2020-07-20 ENCOUNTER — Encounter (HOSPITAL_COMMUNITY): Payer: Self-pay | Admitting: Cardiology

## 2020-08-30 ENCOUNTER — Ambulatory Visit (HOSPITAL_COMMUNITY)
Admission: RE | Admit: 2020-08-30 | Discharge: 2020-08-30 | Disposition: A | Discharge status: Home / Self Care | Payer: 59 | Source: Ambulatory Visit | Attending: Cardiology | Admitting: Cardiology

## 2020-08-30 ENCOUNTER — Encounter (HOSPITAL_COMMUNITY): Payer: Self-pay | Admitting: Cardiology

## 2020-08-30 ENCOUNTER — Other Ambulatory Visit: Payer: Self-pay

## 2020-08-30 VITALS — BP 142/70 | HR 88 | Wt 219.2 lb

## 2020-08-30 DIAGNOSIS — I272 Pulmonary hypertension, unspecified: Secondary | ICD-10-CM

## 2020-08-30 DIAGNOSIS — Z8249 Family history of ischemic heart disease and other diseases of the circulatory system: Secondary | ICD-10-CM | POA: Diagnosis not present

## 2020-08-30 DIAGNOSIS — R0609 Other forms of dyspnea: Secondary | ICD-10-CM | POA: Insufficient documentation

## 2020-08-30 DIAGNOSIS — Z79899 Other long term (current) drug therapy: Secondary | ICD-10-CM | POA: Insufficient documentation

## 2020-08-30 DIAGNOSIS — I480 Paroxysmal atrial fibrillation: Secondary | ICD-10-CM | POA: Diagnosis not present

## 2020-08-30 DIAGNOSIS — Q268 Other congenital malformations of great veins: Secondary | ICD-10-CM

## 2020-08-30 DIAGNOSIS — I2510 Atherosclerotic heart disease of native coronary artery without angina pectoris: Secondary | ICD-10-CM

## 2020-08-30 DIAGNOSIS — Z8673 Personal history of transient ischemic attack (TIA), and cerebral infarction without residual deficits: Secondary | ICD-10-CM | POA: Diagnosis not present

## 2020-08-30 DIAGNOSIS — I10 Essential (primary) hypertension: Secondary | ICD-10-CM | POA: Insufficient documentation

## 2020-08-30 DIAGNOSIS — G4733 Obstructive sleep apnea (adult) (pediatric): Secondary | ICD-10-CM | POA: Insufficient documentation

## 2020-08-30 DIAGNOSIS — Z7901 Long term (current) use of anticoagulants: Secondary | ICD-10-CM | POA: Diagnosis not present

## 2020-08-30 HISTORY — DX: Heart failure, unspecified: I50.9

## 2020-08-30 MED ORDER — APIXABAN 5 MG PO TABS: ORAL_TABLET | ORAL | 9 refills | Status: AC

## 2020-08-30 MED ORDER — METOPROLOL SUCCINATE ER 25 MG PO TB24
25.0000 mg | ORAL_TABLET | Freq: Every day | ORAL | 6 refills | Status: DC
Start: 2020-08-30 — End: 2021-03-20

## 2020-08-30 NOTE — Patient Instructions (Addendum)
INCREASE Toprol XL 25mg  (1 tablet) Daily  Your physician recommends that you schedule a follow-up appointment in: 4 months. Please call our office in April to schedule your follow up appointment  If you have any questions or concerns before your next appointment please send [place] a message through [place] or call our office at [phone].    TO LEAVE A MESSAGE FOR THE NURSE SELECT OPTION 2, PLEASE LEAVE A MESSAGE INCLUDING: . YOUR NAME . DATE OF BIRTH . CALL BACK NUMBER . REASON FOR CALL**this is important as we prioritize the call backs  YOU WILL RECEIVE A CALL BACK THE SAME DAY AS LONG AS YOU CALL BEFORE 4:00 PM

## 2020-08-30 NOTE — Progress Notes (Signed)
PCP: Josetta Huddle, MD Cardiology: Dr. Aundra Dubin EP: Dr. Caryl Comes  74 y.o. with history of nonobstructive CAD and paroxysmal atrial fibrillation returns for followup of pulmonary hypertension.  Patient had ablations for atrial fibrillation in 2009 and 2010. He has OSA and uses CPAP regularly.  He had a cath in 2007 with nonobstructive CAD and a Cardiolite in 2017 without ischemia.    In 5/19, he was in [place], [place].  He was on a treadmill doing his usual routine when he began to feel "funny."  He had an odd feeling in his abdomen.  He lost consciousness and fell to the ground.  He was not unconscious for very long.  He was taken to the [facility].  While there, he had a very extensive workup.  Echo showed pulmonary hypertension with signs of RV failure.  V/Q scan showed no chronic PE and CT chest did not show significant abnormalities.  RHC was done, this showed pulmonary hypertension that appeared vasoreactive with NO testing (see PMH section below for numbers).  He was started on nifedipine XR.  This has been titrated up to 120 mg daily.  He had an echo again in 7/19 at the [facility], this showed moderate RV dilation, borderline decreased RV function.  RVSP was down to 54 mmHg from 74 mmHg.   He had additional episodes of atrial fibrillation in 2021, and ended up having repeat atrial fibrillation ablation in 4/21 at the [facility] in [place].  He had a TIA post-procedure.  Pre-procedure, CT for pulmonary veins showed mild PV narrowing, especially of the left superior pulmonary vein.   Last echo was in 6/21 at the [facility], this showed EF 65%, moderate RV enlargement, mildly decreased RV systolic function, severe RAE, moderate PI, normal IVC (unable to estimate PASP).    He has had episodes of atypical atrial flutter since last ablation, NSR today.   Since the last ablation, he has been more short of breath with exertion.  He clearly dates this to after the ablation. RHC was done, this  showed mild-moderate pulmonary hypertension but was most suggestive of pulmonary venous hypertension with mildly elevated PCWP.  Interestingly, there were large v-waves in the PCWP tracing from the left side compared to the right side.  There was no step up in oxygen saturation from SVC=>PA, no evidence for left to right shunting. Due to concern for pulmonary vein stenosis (had mild left superior PV stenosis pre-ablation), we did a CT pulmonary vein protocol.  This was concerning for moderate left superior PV stenosis with significant ostial calcification.  Other PVs did not appear significantly stenotic.  V/Q scan was suggestive of decreased flow left lung apex.  His coronaries were also heavily calcified on CT PV protocol, unable to comment on degree of stenosis in LAD or RCA due to blooming artifact.  Therefore, he had a ETT-Cardiolite done. This showed no evidence for ischemia or infarction.   He has seen Dr. Corine Shelter at [facility] for evaluation for pulmonary vein intervention.  V/Q scan at [facility] in 10/21 sounds similar to the scan at [facility] with decreased perfusion left upper lung. CPX in 11/21 actually showed peak VO2 23, 91% predicted; no significant limitation.   He returns for followup today.  Symptomatically stable, can walk 30 minutes on flat ground without dyspnea.  Still short of breath walking up a hill.  No chest pain.  No orthopnea/PND.  No palpitations, NSR today. Using CPAP.   Labs (4/19): K 4.9, creatinine 1.02,  LDL 62 Labs (6/21): creatinine 0.89, NT-pro-BNP 196 Labs (8/21): LDL 73, BNP 37 Labs (9/21): K 4.1, creatinine 0.87, hgb 15.5 Labs (10/21): LDL 62, HDl 62  6 minute walk (5/19): 616 m [facility]) 6 minute walk (7/19): 693 m [facility]) 6 minute walk (11/19): 536 m  ECG (personally reviewed): NSR, 1st degree AVB, right axis deviation.   PMH: 1. Atrial fibrillation: Paroxysmal.  S/p ablation in 2009, then repeat ablation in 2010 at [facility].   - Ablation 4/21 at [facility].  2.  TIAs: Initially in 1990s, had another TIA post-ablation in 4/21.  3. GERD 4. HTN 5. Hyperlipidemia 6. BPH 7. OSA: Uses CPAP 8. CAD: LHC in 2007 with nonobstructive disease.  - Cardiolite in 2017 did not show ischemia or infarction.  - CT pulmonary vein protocol (8/21) showed extensive calcification in LAD and RCA, unable to comment on degree of stenosis, CAC 4198 Agatston units (97th percentile).  - ETT-Cardiolite in 9/21: EF 69%, 6' exercise, PVCs and NSVT noted with stress, no ischemia/infarction noted.  9. Pulmonary hypertension:  Diagnosed after syncopal episode on treadmill in 5/19.  - V/Q scan (5/19): No evidence for chronic PE - ANA, TSH, HIV negative (5/19) - CT chest (5/19): No evidence for ILD.  - PFTs (5/19): Mild obstruction.  - Echo (5/19): EF 65-70%, IV septal flattening with moderately enlarged RV with moderately decreased systolic function. PASP 74 mmHg.  - RHC (5/19): Initial RHC with mean RA 4, RV 62/7, PA 68/19 mean 40, PCWP 22, CI 2.45, PVR 3 WU => primarily pulmonary venous hypertension with no evidence for shunt lesion (PA sat 70%, RA sat 69%).  He was then given NTG sprays and RHC was repeated, showing fall in PCWP out of proportion to fall in PCWP and rise in PVR to 3.46 => unmasked PAH.  Next, he was started on inhaled NO and numbers were repeated: mean PA 19, PCWP 9, CI 2.7, PVR 1.5 => correction with NO, so vasoreactive.  - Start on nifedipine CR as vasoreactive.  - Echo (7/19): EF 58%, moderate RV dilation with borderline decreased systolic function, RVSP 54 mmHg.  - Echo (6/21): EF 65%, moderate RV enlargement, mildly decreased RV systolic function, severe RAE, moderate PI, normal IVC (unable to estimate PASP).  - RHC (8/21): mean RA 2, RV 53/4, PA 50/18 mean 33, PCWP mean 17 on right with v wave to 22, PCWP mean 16 on left with v-wave to 34, no evidence for shunt lesion (SVC sat = PA sat), CI 3, PVR 2.44 WU => most suggestive of pulmonary venous hypertension.  - CPX  (11/21, [facility]): Peak VO2 23 (91% predicted).  10. Holter (6/19) with occasional up to 2.8 sec nocturnal pauses.  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  - V/Q scan (9/21): diminished perfusion lung apices greater on left.   SH: Married, lives in [place], 2 daughters, [facility], never smoked.   Family History  Problem Relation Age of Onset  . Heart attack Father   . Coronary artery disease Father   . Transient ischemic attack Mother   . Coronary artery disease Mother        h/o TIA   . Stroke Mother   . Colon cancer Other        aunt and uncle   ROS: All systems reviewed and negative except as per HPI.   Current Outpatient Medications  Medication Sig Dispense Refill  .  apixaban (ELIQUIS) 5 MG TABS tablet TAKE 1 TABLET(5 MG) BY MOUTH TWICE DAILY 60 tablet 9  . atorvastatin (LIPITOR) 20 MG tablet Take 1 tablet (20 mg total) by mouth daily. 90 tablet 3  . Cholecalciferol (DIALYVITE VITAMIN D 5000) 125 MCG (5000 UT) capsule Take 5,000 Units by mouth daily.    . Coenzyme Q10 (COQ10) 100 MG CAPS Take 100 mg by mouth daily.    Marland Kitchen docusate sodium (COLACE) 100 MG capsule Take 200 mg by mouth daily.     . famotidine (PEPCID) 40 MG tablet Take 40 mg by mouth every evening.    Marland Kitchen FIBER PO Take 5 capsules by mouth daily.    . finasteride (PROSCAR) 5 MG tablet Take 5 mg by mouth daily.      . hydrocortisone cream 1 % Apply 1 application topically daily as needed for itching.    . metoprolol succinate (TOPROL-XL) 25 MG 24 hr tablet Take 1 tablet (25 mg total) by mouth daily. Take with or immediately following a meal. 30 tablet 6  . Multiple Vitamin (MULTIVITAMIN WITH MINERALS) TABS tablet Take 1 tablet by mouth daily.    Marland Kitchen NIFEdipine (PROCARDIA XL/NIFEDICAL XL) 60 MG 24 hr tablet Take 2 tablets (120 mg total) by mouth daily. 180 tablet 3  . polyethylene glycol powder (GLYCOLAX/MIRALAX) 17 GM/SCOOP powder Take 17  g by mouth daily as needed for moderate constipation.     . ramipril (ALTACE) 10 MG capsule Take 1 capsule (10 mg total) by mouth daily. 90 capsule 3  . tamsulosin (FLOMAX) 0.4 MG CAPS capsule Take 0.4 mg by mouth in the morning and at bedtime.     No current facility-administered medications for this encounter.   BP (!) 142/70   Pulse 88   Wt 99.4 kg (219 lb 3.2 oz)   SpO2 96%   BMI 25.33 kg/m  General: NAD Neck: No JVD, no thyromegaly or thyroid nodule.  Lungs: Clear to auscultation bilaterally with normal respiratory effort. CV: Nondisplaced PMI.  Heart regular S1/S2, no S3/S4, no murmur.  No peripheral edema.  No carotid bruit.  Normal pedal pulses.  Abdomen: Soft, nontender, no hepatosplenomegaly, no distention.  Skin: Intact without lesions or rashes.  Neurologic: Alert and oriented x 3.  Psych: Normal affect. Extremities: No clubbing or cyanosis.  HEENT: Normal.   Assessment/Plan: 1. Pulmonary hypertension: Initial workup was done at [facility] in [place].  This may be the cause of his syncopal episode in 5/19. Echo in 5/19 showed elevated PA pressure with RV dysfunction.  V/Q scan and CT chest were negative in 2019.  Serologic workup was negative. Initial RHC looked like pulmonary venous hypertension but NTG spray was able to unmask PAH.  NO challenge in the cath lab led to a significant response with elimination of PH and fall in PVR to 1.5 WU. Therefore, patient was thought to have vasoreactive PH and was started on nifedipine XR 30 mg daily and titrated up. He is now at goal dose of nifedipine, 120 mg daily.  He has OSA but this is treated with CPAP. OSA does not explain the extent of his PH.  Often, CCB responsive PH shows decrement in effect of the CCB treatment over time, requiring the addition of selective pulmonary vasodilators.  Recently, patient has had increased dyspnea (since his afib ablation).  Echo in 6/21 showed that the RV was still moderately dilated with mildly  decreased systolic function.  RHC in 8/21 looked like mild-moderate primarily pulmonary venous hypertension (  mildly elevated PCWP).  No evidence for left=>right shunting. See below, concern for pulmonary vein stenosis post redo PVI in 4/21.   - Continue nifedipine XR 120 mg daily.  - For now, will not additional pulmonary vasodilator until we have full evaluation of PV stenosis.   2. Atrial fibrillation: Paroxysmal.  NSR today.  He is s/p redo ablation in 4/21.   - Continue Eliquis.   3. HTN:  With mildly increased BP, will increase Toprol XL to 25 mg daily. 4. OSA: Using CPAP.  5. CAD: Nonobstructive on 2007 cath.  CT PV protocol showed heavy LAD and RCA calcification, unable to comment on degree of stenosis due to blooming artifact.  High risk calcium score.  This was followed up with ETT-Cardiolite that showed no ischemia or infarction.  No worrisome chest pain, has had exertional dyspnea since redo afib ablation.  Cannot fully rule out balanced ischemia.  - If it does not appear that his symptoms are coming for pulmonary vein stenosis, will likely need coronary angiography.  Will see how his workup with Dr. Corine Shelter goes before doing this.  - No ASA given apixaban use.  - Continue atorvastatin 20 mg daily, good lipids in 10/21.  6. Pulmonary vein stenosis: Patient had mild left superior PV stenosis prior to most recent ablation in 4/21.  He had 2 prior ablations in 2009 and 2010.  Since 4/21 ablation, he has noted new exertional dyspnea with moderate-heavy activity.  CT PV protocol in 8/21, as above, showed moderate left superior PV stenosis.  V/Q scan also showed decreased perfusion left lung apex.  The LSPV ostium is calcified.  Generally, would not have expected significant dyspnea with a single significant PV stenosis, but concerned this could be the case here based on timing of symptoms and V/Q scan findings (V/Q findings were similar at [facility] as well with decreased perfusion LUL).  - He has seen  Dr Corine Shelter at [facility] with plan for left superior pulmonary vein stenting later this month.   7. Exertional dyspnea: As above, dates since post-AF ablation.  Not markedly severe but distressing.  RHC with pulmonary venous hypertension primarily.  Concern as above for LSPV stenosis.  Also cannot fully rule out obstructive coronary disease (?balanced ischemia on Cardiolite but unlikely).  TSH normal.  Additionally, some of his symptomatology seems related to anxiety as well.  - Plan will be left superior pulmonary vein stenting.  If he still has significant exertional dyspnea after this, would proceed with coronary angiography as next step.   Followup 4 months with me.   Loralie Champagne 08/30/2020

## 2020-09-21 ENCOUNTER — Encounter: Payer: Self-pay | Admitting: *Deleted

## 2020-10-03 ENCOUNTER — Ambulatory Visit: Payer: BC Managed Care – PPO | Admitting: Internal Medicine

## 2020-11-17 ENCOUNTER — Telehealth: Payer: BLUE CROSS/BLUE SHIELD

## 2020-11-17 NOTE — Telephone Encounter
Patient diagnosis: Pulmonary Stenosis  - If patient does not know diagnosis; have they been seeing a cardiologist for their heart condition since they were a child?    Has patient had an Echo done within the past 6 months?:   []  Yes, advised to MAIL the CD in DICOM format to the office.    [x]  No     Are patients medical records in [facility]?:   []  Yes                    [x]  No, advised to fax medical records.      Any pertinent details/requests?:                 []  No              [x]  Yes, Please explain: Wife advise patient has primary insurance with [employer] with [employer]. Advise wife to check if is in-network. Unable to verify primary. Member ID: GROUP#: [id number]

## 2020-11-21 NOTE — Telephone Encounter
Left voicemail for patient asking that he call back to schedule consult with Dr. Tobie Poet.    Did let him know that we need cath/CT images prior to consult.  Also asked that he let us know if an echo has been within the last 6 months--if so, we need CD copy of that as well.  If not, echo to be scheduled along with consult.

## 2021-03-20 ENCOUNTER — Other Ambulatory Visit (HOSPITAL_COMMUNITY): Payer: Self-pay

## 2021-03-20 MED ORDER — METOPROLOL SUCCINATE ER 25 MG PO TB24
25.0000 mg | ORAL_TABLET | Freq: Every day | ORAL | 0 refills | Status: AC
Start: 2021-03-20 — End: ?

## 2021-05-04 ENCOUNTER — Other Ambulatory Visit (HOSPITAL_COMMUNITY): Payer: Self-pay

## 2021-05-04 MED ORDER — NIFEDIPINE ER OSMOTIC RELEASE 60 MG PO TB24: 120.0000 mg | ORAL_TABLET | Freq: Every day | ORAL | 0 refills | Status: DC

## 2021-05-05 ENCOUNTER — Telehealth: Payer: BLUE CROSS/BLUE SHIELD

## 2021-05-05 NOTE — Telephone Encounter
Call Back Request      Reason for call back:   Pt calling for Greig Castilla in regards to approval for upcoming echo and appt with Dr. Tobie Poet.    Cbn: [phone]    Thank You     Any Symptoms:  []  Yes  [x]  No       If yes, what symptoms are you experiencing:    o Duration of symptoms (how long):    o Have you taken medication for symptoms (OTC or Rx):      Patient or caller has been notified of the 24-48 hour turnaround time.

## 2021-05-05 NOTE — Telephone Encounter
Kyle Harris spoke to pt    Appointments have been cleared    Ins updated

## 2021-05-09 ENCOUNTER — Other Ambulatory Visit (HOSPITAL_COMMUNITY): Payer: Self-pay | Admitting: Cardiology

## 2021-05-09 ENCOUNTER — Ambulatory Visit: Payer: BLUE CROSS/BLUE SHIELD | Attending: Cardiovascular Disease

## 2021-05-09 ENCOUNTER — Inpatient Hospital Stay: Payer: BLUE CROSS/BLUE SHIELD | Attending: Cardiovascular Disease

## 2021-05-09 DIAGNOSIS — I2721 Secondary pulmonary arterial hypertension: Secondary | ICD-10-CM

## 2021-05-09 DIAGNOSIS — Q268 Other congenital malformations of great veins: Secondary | ICD-10-CM

## 2021-05-09 DIAGNOSIS — E782 Mixed hyperlipidemia: Secondary | ICD-10-CM

## 2021-05-09 DIAGNOSIS — I480 Paroxysmal atrial fibrillation: Secondary | ICD-10-CM

## 2021-05-09 DIAGNOSIS — I10 Essential (primary) hypertension: Secondary | ICD-10-CM

## 2021-05-09 MED ORDER — NIFEDIPINE ER OSMOTIC RELEASE 60 MG PO TB24
120 mg | ORAL_TABLET | Freq: Every day | ORAL | 3 refills | Status: AC
Start: 2021-05-09 — End: ?

## 2021-05-09 NOTE — Patient Instructions
[facility]    Your visit instructions:  Follow up: 12 months with an echocardiogram.   We recommend that you have your teeth cleaned every 6 months.  Please call with any concerns or changes in your health.    Please visit our website for updates, newsletters and helpful links: [email or web address]    Helpful contact information:  Email: [email or web address]  Appointments: [phone]  [facility] Patient Affairs/Messages: [phone]  Administrative Office: [phone]  Fax: [phone]    Address:    [facility]  [address]  [place], [place] [address]    If you are providing an imaging disc from an outside provider please request that files be supplied in DICOM format.

## 2021-05-09 NOTE — Consults
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 male with the following cardiac history:  1. Diagnosed with atrial fibrillation in the 1980s. He would note palpitations and shortness of breath with episodes. The atrial fibrillation episodes were intermittent but worsened over time. He had been on various antiarrhythmic medications including Flecainide, Sotalol and eventually Amiodarone between 2003 and 2009. He also had electrical cardioversions x3 in the past.   2. Underwent AF ablation procedures in 11/2007 and 01/2009 by Dr. Ronn Melena at [facility].   3. In May of 2019, he was exercising on a treadmill when he began to feel funny. He then lost consciousness and was taken to [facility] in [place]. He underwent cardiac workup including an echocardiogram which showed RV enlargement.    4. Underwent RHC showed PAH with a large drop in mean PA pressure noted with inhaled nitric oxide. He was started on Nifedipine 30 mg XR and titrated, eventually up to 120 mg daily.   5. Repeat echocardiogram 7/19 at the [facility] which showed RVSP was down from 74 mmHg to 54 mmHg.   6. While in [place], he had recurrent episodes of palpitations and went back to see his EP, Dr. Ellene Route. He underwent another AF ablation on 01/19/20. During the procedure, AF could not be induced and there was no clear evidence of arrhythmic substrates. His pre-procedure CT scan showed mild narrowing of his left superior pulmonary vein.   7. After this AF ablation, he had a TIA and also notes more SOB with exertion.   8. Noted to have atypical atrial flutter since his last AF ablation.   9. He was seen by Dr. Marca Ancona at [facility] in [place]. He underwent CT pulmonary vein protocol which showed concern for moderate left superior PV stenosis with significant ostial calcification. VQ scan was suggestive of decreased flow in the left lung apex. His coronaries were also heavily calcified on CT, unable to comment on the degree of stenosis in LAD or RCA region due to blooming artifact. Cardiolite stress test showed no evidence for ischemia.   10. Seen by Dr. Delrae Sawyers at [facility] 07/27/2020. Plan for PV stenting, but found to have too much calcium and was also noted to have good blood flow.   11. Kyle Harris has moved from [place] to [place], but also has a vacation home in [place] and plans to spend a significant amount of time in [place]. Therefore, he and his wife have presented to [facility] to establish [facility] care.       Interval History: Kyle Harris presents today for evaluation, accompanied by his wife. He is feeling relatively well overall. He reports that he does have some intermittent dyspnea at rest and sometimes feels as though he need to take a deep breath. No problems with dyspnea on exertion and is able to exercise regularly.     He also has had intermittent palpitations. He is able to take his pulse and reports some intermittent abnormal pulse rates. He was noted to have some atrial flutter post his last AF ablation. No recent sustained palpitations.  No problems with resting or exertional chest pain. No dizziness or syncope.     Home blood pressures running 120s, but he is only taking his blood pressures sporadically.     Exercise: He was walks 3-4 miles per day and also uses the rowing machine for 15-20 minutes. No decreased exercise tolerance.       Past Medical/Surgical History:   1. Cardiac as above  2. TIAs in 1990s, another 4/21  3. HTN  4. Hyperlipidemia  5. BPH  6. GERD  7. OSA, on CPAP  8. COPD  9. s/p tonsillectomy      Family History:  No history of CHD.    Social History:   Married with 2 daughters.  Previously worked as a Arts administrator.  Former Financial trader.  No history of smoking.   Occasional alcohol intake.   No history of illicit drug use.    Medications:  Medications that the patient states to be currently taking   Medication Sig   ? atorvastatin 20 mg tablet Take 20 mg by mouth.   ? Coenzyme Q10 100 MG capsule Take 100 mg by mouth.   ? CRANBERRY EXTRACT PO Take by mouth.   ? docusate 100 mg capsule Take 200 mg by mouth.   ? ELIQUIS 5 MG tablet    ? famotidine 20 mg tablet Take 20 mg by mouth.   ? finasteride 5 mg tablet Take 5 mg by mouth.   ? metoprolol succinate 25 mg 24 hr tablet    ? Multiple Vitamins-Minerals (MULTI VITAMIN/MINERALS) TABS Take 1 tablet by mouth.   ? polyethylene glycol powder Take 17 g by mouth.   ? ramipril 10 mg capsule    ? tamsulosin 0.4 mg capsule Take 0.4 mg by mouth.   ? [DISCONTINUED] NIFEdipine (PROCARDIA XL) 60 mg 24 hr tablet Take 120 mg by mouth.   ? [DISCONTINUED] NIFEdipine (PROCARDIA XL) 60 mg 24 hr tablet          Allergies: NKDA    Review of Systems:   Constitutional: No fevers or chills.   Skin: No rashes or bruising.   HEENT: No visual disturbances. No hearing loss, tinnitus, hoarseness, dysphagia, epistaxis.   Respiratory: No cough or wheeze.   Cardiac: As per HPI.   Vascular: No claudication or varicose veins.   GI: No nausea, vomiting or constipation.   GU: No dysuria or hematuria.   Musculoskeletal: No myalgias, bone pain or joint pain. Endocrine: No diabetes or thyroid disorder.   Hematologic: No bleeding or bruising issues.   Neurologic: No stroke, seizure, weakness.   Psychologic: No history of depression. No anxiety.      Physical Exam:  BP 141/68  ~ Pulse 52  ~ Temp 36.6 ?C (97.9 ?F) (Temporal)  ~ Ht 6' 6'' (1.981 m)  ~ Wt 212 lb 8 oz (96.4 kg)  ~ SpO2 95%  ~ BMI 24.56 kg/m?   General: awake and alert, NAD  HEENT: normocephalic, EOMI, wearing facemask. Neck: AT/NC, supple, no thyroidmegaly  Pulmonary: Clear to auscultation with good air movement, no increased work of breathing   CV: RRR. Normal S1, S2 with 1/6 SEM at LUSB. No rub or gallop   Abdomen: Soft, NT/ND, normal BS, no masses. No liver edge palpable.  Extremities: WWP. Trace LE edema. No clubbing or cyanosis or edema  Neuro: Grossly intact, no focal deficits with normal mood and affect       Labs:  03/09/2021 ([facility]):  Na 137 K  5.1 Cl 103 BUN 19.7 Creat 0.98 Gluc 90 Pro-BNP 349 Albumin 4.8 AST 28 ALT 12 ALP 67 TBili 0.7  Hgb 14.3 Hct 42.7 Plt 183       Cardiodiagnostics:  Echocardiogram 05/19/2021:  2D AND M-MODE MEASUREMENTS (normal ranges within parentheses): Left Ventricle: Aorta/Left Atrium: IVSd (2D): 1.0 cm Aortic Root, d (2D): 4.0 cm LVPWd (2D): 1.1 cm Right Ventricle: LVIDd (2D): 4.6 cm RV major, A4C 9.7 cm LVIDs (2D): 2.4 cm RV basal minor, A4C 5.0 cm LV FS (2D): 48.6 % (>25%) RV Mid-cavity 4.8 cm LV SYSTOLIC FUNCTION BY 2D PLANIMETRY (MOD): EF-A4C View: 71 % LV Vol A4C: EDV 101 ml ESV 29 ml LV DIASTOLIC FUNCTION: MV Peak E: 84 cm/s e' medial 6.4 Decel Time: 336 msec Right Ventricle: RV fractional area change: 46 % RA Volumes: RA VOL A4C: 97 ml RA Vol A4C index: 42 ml/m? RA area: LA Volumes: LA Vol A4C: 29 ml LA Vol A4C index: 13 ml/m? LA area: Aortic Valve: AoV Max Vel: 1.60 m/s AoV Peak PG: 10 mmHg AoV Mean PG: LVOT Vmax: 0.68 m/s LVOT VTI: AoV VTI: LVOT Diameter: 2.3 cm AoV Area, Vmax: 1.77 cm? AoV Area, VTI: Tricuspid Valve and PA/RV Systolic Pressure: TR Max Velocity: 3.6 m/s RA Pressure: 15 mmHg RVSP/PASP: 66 mmHg Pulmonic Valve: PV Max Velocity: 0.9 m/s PV Max PG: 3 mmHg PV Mean PG: PV AT: 145 msec Systemic Veins: IVC Diameter: 2.83 cm   PHYSICIAN INTERPRETATION: LEFT VENTRICLE: Normal left ventricular size. Normal wall thickness. Normal systolic function. Visually estimated left ventricular ejection fraction 65-70%. Indeterminate diastolic function. RIGHT VENTRICLE: Moderately enlarged right ventricular size (RV/LV ratio 1.35) and normal systolic function (Fractional area change 46 %) (DTI 15.9 cm/s). RV wall thickness is mildly increased. LEFT ATRIUM: Normal left atrial size. RIGHT ATRIUM: Mild right atrial enlargement. Right atrial pressure is estimated at 15 mmHg. MITRAL VALVE: Normal mitral valve thickness. No mitral annular calcification. No evidence of mitral valve regurgitation. TRICUSPID VALVE: Structurally normal tricuspid valve, with normal leaflet excursion. Trace tricuspid valve regurgitation. The tricuspid regurgitant velocity is 3.6 m/s, and with an assumed right atrial pressure of 15 mmHg, the estimated right ventricular systolic pressure is severely elevated at 66 mmHg. AORTIC VALVE: Moderately thickened/calcified aortic valve with restricted cusp opening. Trileaflet aortic valve morphology. Aortic valve area was not quantified by continuity equation on this study. No evidence of aortic valve regurgitation. PULMONIC VALVE: Moderate pulmonic valve regurgitation. Peak PR velocity is 2.4 m/sec, estimated mean PAP is 24 mmHg. PULMONARY ARTERY: The pulmonary artery is not visible. PULMONARY VEINS: There appears to be flow acclereration by color Doppler in the left upper pulmonary vein entry to the left atrium, by CW Doppler the peak velocity is 2.3 m/sec, PIG= 22 mmHg, mean gradient= 6 mmHg. SYSTEMIC VEINS: The inferior vena cava is dilated and exhibits less than 50% respiratory change. PERICARDIUM: There is no evidence of pericardial effusion. CONCLUSIONS  1. Normal LV size, normal wall thickness, normal systolic function, indeterminate diastolic function.  2. Left ventricular ejection fraction is approximately 65-70%.  3. Moderately enlarged right ventricular size and normal systolic function.  4. Normal left atrial size. Mild right atrial enlargement.  5. Aortic valve area was not quantified by continuity equation on this study.  6. Severely elevated pulmonary artery systolic pressure (66 mmHg).  7. Moderate pulmonic valve regurgitation.  8. There are no prior echocardiograms available for comparison.  9. Mildly increased RV wall thickness.  10. There appears to be flow acclereration by  color Doppler in the left upper pulmonary vein entry to the left atrium, by CW Doppler the peak velocity is 2.3 m/sec, PIG= 22 mmHg, mean gradient= 6 mmHg.    Cardiopulmonary Stress Test 03/02/2021 [facility] [place], per report):  Impressions:  1. Stress ECG was negative for ishemia. Patient reported moderate dyspnea at peak exercise.  2. Minor arrhythmias. PVC pairs, VT 3-5 beats. Intermittent 2nd degree type I AVB.  3. Below average peak VO2 (25.2 mL/min/kg, 99% predicted) with some pulmonary impairment to exercise. Submaximal cardiometabolic effort given (RER 1.03).   4. No clear cardiac output impairment based on continuous risk in O2 pulse (peak 107% predicted). Heart rate responded appropriately to exercise. Normal heart rate recovery.  5. Hypertensive resting blood pressure, but appropriate response to exercise.  6. Abnormal pulmonary responses include mild ventilatory impairment based on evidence of expiratory flow limitation due to encroachment of tidal breaths on maximum flow volume loop. On average 80% of each exhalation was flow limited peak exercise. Additionally desaturation with exercise (nadir 92%) and low resting end tidal CO2 but which did rise with exercise.  7. Reasons for low peak VO2 likely include a less than maximal effort, excess weight, and pulmonary limitation.  8. Compared to test of 12/28/19, results are similar with a peak VO2 falling less than 10% from 26.2 to 25.2 mL/min/kg or 101 to 99% predicted.    Cardiopulmonary Stress Test 07/27/2020 ([facility], per report):   Conclusion: 1) The data suggests a maximal exercise test, so maximal cardiorespiratory ??capacity could be determined. ??2) Based on the peak oxygen consumption (VO2) achieved, functional capacity ??would be consistent with no functional impairment, with peak VO2 22.9?ml/kg/min (91% of predicted normals). Normative peak predicted VO2 values ??are corrected for age, gender, and weight. 3) There were maximal signs of a pulmonary limitation to exercise. ECG ??notable for baseline sinus rhythm with first degree AV block. Ectopy ??included multifocal PVCs, Ventricular couplets, Ventricular triplets, PACs, ?Type 2 AV block. ST segment changes included horizontal depression. Heart ??rate recovery was normal.?4) Aerobic reserve was low-normal suggesting physical deconditioning may be ??a potential limitation to exercise capacity.?5) Ventilatory reserve limits were approached at peak exercise without ??apparent pulmonary limitation to exercise. Resting spirometry suggests a ??mild obstruction. The MVV is normal. Exercise oscillatory ventilation ??criteria was not met. 6) The O2 pulse (an indicator of stroke volume) increased throughout exercise. ??7) No prior CPX available for comparison. This is the metabolic report for the Cardiopulmonary Exercise Test. The ??Stress ECG report is available on a separate report.     Lung Perfusion 06/28/2020 [facility], per report):  Findings: The perfusion images demonstrate mildly reduced perfusion to the left lung most pronounced in the upper lung zone, with otherwise relatively homogeneous distribution of radiotracer in the lungs. Differential pulmonary perfusion is as follows: Left upper zone: 6.5% Right upper zone: 14.7% Left mid zone: 17.0%  Right mid zone: 30.2% Left lower zone: 11.1%  Right lower zone: 20.5% Total left lung: 34.6% ?Total right lung: 65.4%   Impression:  1. ?Differential?pulmonary perfusion is 35% to the left lung and 65% to the right lung; see above discussion.     Myocardial Perfusion Imaging 05/31/2020 [name], per report):   Nuclear stress EF: 69%. ? The study is normal. ? This is a low risk study. ? The left ventricular ejection fraction is hyperdynamic (>65%).  1. Normal perfusion without ischemia or infarction.  2. Normal LVEF, 69%.  3. Frequent PVCs and runs of non-sustained VT during stress (4  beat max duration).  4. Brief Type 2 AV block, Mobitz I (Wenckebach) in recovery.  5. Average functional capacity (6:00 min:s; 7.0 METS).  6. This is a low risk study. Stress Findings The patient exercised following the Bruce protocol. The patient reported shortness of breath during the stress test. The test was stopped because the patient complained of shortness of breath. Recovery time: 5 minutes. ECG Baseline ECG exhibits normal sinus rhythm.. Nuclear Study Quality Overall image quality is excellent. There is no nuclear artifact present. Response to Stress Arrhythmias during stress: occasional PVCsnon-sustained VT . The patient developed Wenckebach in recovery Arrhythmias were not?significant. ECG was interpretable and there was no significant change from baseline. Nuclear Measurements Study was gated. Isotope administration Rest isotope was administered with an IV injection of 10.1 mCi Tc57m Tetrofosmin. Rest SPECT images were obtained approximately 45 minutes post tracer injection. Stress isotope was administered with an IV injection of 31.7 mCi Tc66m Tetrofosmin at peak exercise. Stress SPECT images were obtained approximately 30 minutes post tracer injection. Rest Perfusion Rest perfusion normal. Stress Perfusion Stress perfusion normal. History and Indications Indication for Stress Test: Evaluation of extent and severity of coronary artery disease History: COPD, AFIB/AFLUTTER Cardiac Risk Factors: Hypertension and Lipids Symptoms: Chest Pain and SOB Overall Study Impression Myocardial perfusion is normal. The study is normal. This is a low risk study. Overall left ventricular systolic function was normal. LV cavity size is normal. Nuclear stress EF: 69%. The left ventricular ejection fraction is hyperdynamic (>65%). There is no prior study for comparison.     Cardiac CT Scan 05/24/20 [facility], per report):  FINDINGS: Non-cardiac: See separate report from [facility]. No thrombus noted in the left atrium or appendage. The pulmonary veins drain normally to the left atrium. There is significant calcification noted along the superior left atrium. Mild calcification at the ostium of the right superior pulmonary vein. Heavy, near circumferential calcification at the ostium of the left superior pulmonary?vein. RSPV: 18.5 x 16 mm, mild calcification at ostium. RIPV: 15 x 13.5 mm, very mild ostial calcification LSPV: 8.2 x 7.8 mm, near circumferential calcification at the ostium. More distal from the LA, the LSPV widens to 15.7 x 11 mm. Suspect?moderate stenosis. LIPV: 15 x 11 mm Calcium Score: 4198 Agatston units. Coronary Arteries: Right dominant with no anomalies LM: Extensive calcified plaque, suspect mild (<50%) stenosis. LAD system: Extensive calcified plaque in the proximal to mid LAD, unable to comment on severity of stenosis due to prominent blooming artifact. Circumflex system: Extensive proximal LCx calcification. Suspect mild (<50%) stenosis but difficult to be certain with prominent blooming artifact. RCA?system: There is extensive mixed plaque in the mid to distal RCA. Unable to comment on severity of stenosis due to prominent motion and blooming artifact. IMPRESSION:  1. Coronary artery calcium score 4198 Agatston units. This places the patient in the 97th percentile for age and gender, suggesting high risk for future cardiac events.  2. Very extensive coronary artery calcification makes identification of degree of coronary artery stenosis very difficult. Unable to comment on severity of disease in the LAD or RCA, probably mild stenosis in the proximal LCx.  3. Calcification as described above in the left atrium and especially around the junction of the left superior pulmonary vein with the left atrium. There does appear to be moderate stenosis of the left superior pulmonary vein. The other pulmonary veins do not appear to show significant stenosis.     Holter monitor on 04/07/20 [facility], per report):  Atypical atrial flutter with episodes of slow ventricular response. He was also noted to have sinus pauses, longest of which was 3.31 seconds.     Echocardiogram 03/03/20 [facility], per report):   1. Normal left ventricular chamber size.  2. Calculated 2-D linear left ventricular ejection fraction 65 %.  3. No regional wall motion abnormalities.  4. Moderately enlarged right ventricular chamber size by visual estimate.  5. Mildly reduced right ventricular systolic function.  6. Estimated right ventricular systolic pressure 55 mmHg assuming right atrial pressure of 5  mmHg.  7. Severely enlarged right atrial size by visual estimate.  8. Sclerotic aortic valve. No regurgitation or significant stenosis from limited  interrogation. Mean gradient 4 mmHg.  9. Trivial mitral valve regurgitation.  10. Mild tricuspid valve regurgitation.  11. Moderate pulmonary valve regurgitation.  12. Normal inferior vena cava size with normal inspiratory collapse (>50%).  13. No pericardial effusion.  14. Compared to the report of 08/19/2019 no significant change has occurred. Prior exam was  limited; RVSP could not be estimated.      Impression: 75 y.o. male with  1. PAF s/p AF ablation x 3 and subsequent moderate left superior PV stenosis, not amenable to stenting given significant calcification  2. PAH, now on Nifedipine 120 mg daily  3. Atrial flutter, brief episodes noted on ambulatory cardiac monitoring, on MTP  4. Nonobstructive CAD with heavy calcification noted on cardiac CT 04/2020  5. HTN on ramipril  6. Hyperlipidemia, on statin therapy.   7. OSA, using CPAP at night      Plan:   1. We reviewed the results of his echocardiogram today which shows normal LV size and function, moderate RVE with normal RV function and mild RAE, severely elevated PASP and moderate PR. He was noted to have flow acceleration in the LUPV entry into the LA. We do not have prior imaging to compare and will request his most recent echocardiogram imaging.   2. We will also request his recent cardiac catheterization imaging and lung perfusion imaging for our records.   3. Continue Nifedipine for PAH.  4. His blood pressure is elevated in clinic today. He only takes his blood pressure at home sporadically. We asked him to start taking his blood pressure on a regular basis and notify us if his systolic blood pressure is >130 mmHg.   5. Continue current dose of ramipril for now.   6. Continue MTP and Eliquis for history of atrial arrhythmias.   7. Request the he get a repeat lipid panel with his next lab draw. He is seeing a new PCP in [place] soon.   8. Follow up in 12 months with repeat echocardiogram or anytime sooner if needed.     Patient was seen, examined, and discussed with attending physician, Dr. Dyanne Iha.    Kyle Limerick, NP  [facility] Nurse Practitioner

## 2021-05-10 DIAGNOSIS — Q268 Other congenital malformations of great veins: Secondary | ICD-10-CM

## 2021-06-01 ENCOUNTER — Inpatient Hospital Stay: Payer: PRIVATE HEALTH INSURANCE | Attending: Cardiovascular Disease

## 2021-06-01 ENCOUNTER — Inpatient Hospital Stay: Payer: BLUE CROSS/BLUE SHIELD | Attending: Cardiovascular Disease

## 2021-06-01 DIAGNOSIS — Z719 Counseling, unspecified: Secondary | ICD-10-CM

## 2021-06-06 ENCOUNTER — Inpatient Hospital Stay: Payer: BLUE CROSS/BLUE SHIELD | Attending: Cardiovascular Disease

## 2021-06-06 DIAGNOSIS — R6889 Other general symptoms and signs: Secondary | ICD-10-CM

## 2021-06-07 ENCOUNTER — Inpatient Hospital Stay: Payer: BLUE CROSS/BLUE SHIELD | Attending: Cardiovascular Disease

## 2021-06-07 ENCOUNTER — Inpatient Hospital Stay: Payer: PRIVATE HEALTH INSURANCE | Attending: Cardiovascular Disease

## 2021-06-07 DIAGNOSIS — R6889 Other general symptoms and signs: Secondary | ICD-10-CM

## 2021-06-27 ENCOUNTER — Other Ambulatory Visit (HOSPITAL_COMMUNITY): Payer: Self-pay | Admitting: Internal Medicine

## 2021-12-09 IMAGING — NM NM PULMONARY PERF PARTICULATE
8 series · 8 of 8 positions shown · non-contrast
Comparison: Chest radiographs 06/08/2020

CLINICAL DATA: Respiratory failure, chest pain, shortness of
breath, pulmonary vein stenosis, history atrial fibrillation, COPD,
hypertension, GERD

EXAM:
NUCLEAR MEDICINE PERFUSION LUNG SCAN
TECHNIQUE: Perfusion images were obtained in multiple projections after
intravenous injection of radiopharmaceutical.
Ventilation scans intentionally deferred if perfusion scan and chest
x-ray adequate for interpretation during COVID 19 epidemic.
RADIOPHARMACEUTICALS:  4.4 mCi Bc-44m MAA IV

[Series 1: ant/post perf · 4.14mm/px · 1 of 1 slices shown (1 of 2)]
[im 1/1]
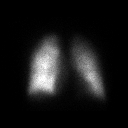

[Series 1: ant/post perf · 4.14mm/px · 1 of 1 slices shown (2 of 2)]
[im 1/1]
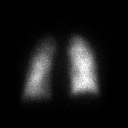

[Series 2: lao/rpo perf · 4.14mm/px · 1 of 1 slices shown (1 of 2)]
[im 1/1]
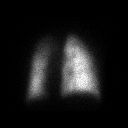

[Series 2: lao/rpo perf · 4.14mm/px · 1 of 1 slices shown (2 of 2)]
[im 1/1]
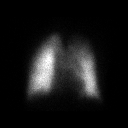

[Series 3: lpo/rao perf · 4.14mm/px · 1 of 1 slices shown (1 of 2)]
[im 1/1]
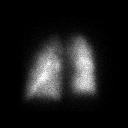

[Series 3: lpo/rao perf · 4.14mm/px · 1 of 1 slices shown (2 of 2)]
[im 1/1]
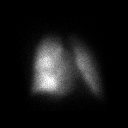

[Series 4: lt lat/rt lat perf · 4.14mm/px · 1 of 1 slices shown (1 of 2)]
[im 1/1]
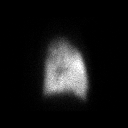

[Series 4: lt lat/rt lat perf · 4.14mm/px · 1 of 1 slices shown (2 of 2)]
[im 1/1]
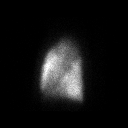

[8 of 8 positions shown; findings below may reference images not displayed]

FINDINGS: Diminished perfusion at the apices greater on LEFT.

This corresponds to emphysematous changes on radiographs.

No segmental or subsegmental perfusion defects are identified which
are suspicious for pulmonary embolism.
IMPRESSION: No scintigraphic evidence of pulmonary embolism.

Diminished uptake at the apices, LEFT greater than RIGHT, suspect
related COPD.

## 2022-05-08 ENCOUNTER — Ambulatory Visit: Payer: Commercial Managed Care - PPO | Attending: Cardiovascular Disease

## 2022-05-08 ENCOUNTER — Inpatient Hospital Stay: Payer: BLUE CROSS/BLUE SHIELD | Attending: Cardiovascular Disease

## 2022-05-08 DIAGNOSIS — I480 Paroxysmal atrial fibrillation: Secondary | ICD-10-CM

## 2022-05-08 DIAGNOSIS — Q268 Other congenital malformations of great veins: Secondary | ICD-10-CM

## 2022-05-08 DIAGNOSIS — I2721 Secondary pulmonary arterial hypertension: Secondary | ICD-10-CM

## 2022-05-08 DIAGNOSIS — I350 Nonrheumatic aortic (valve) stenosis: Secondary | ICD-10-CM

## 2022-05-08 DIAGNOSIS — I10 Essential (primary) hypertension: Secondary | ICD-10-CM

## 2022-05-08 DIAGNOSIS — E782 Mixed hyperlipidemia: Secondary | ICD-10-CM

## 2022-05-08 NOTE — Progress Notes
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 male with the following cardiac history:  1. Diagnosed with atrial fibrillation in the 1980s. He would note palpitations and shortness of breath with episodes. The atrial fibrillation episodes were intermittent but worsened over time. He had been on various antiarrhythmic medications including Flecainide, Sotalol and eventually Amiodarone between 2003 and 2009. He also had electrical cardioversions x3 in the past.   2. Underwent AF ablation procedures in 11/2007 and 01/2009 by Dr. Ronn Melena at [facility].   3. In May of 2019, he was exercising on a treadmill when he began to feel funny. He then lost consciousness and was taken to [facility] in [place]. He underwent cardiac workup including an echocardiogram which showed RV enlargement.    4. Underwent RHC showed PAH with a large drop in mean PA pressure noted with inhaled nitric oxide. He was started on Nifedipine 30 mg XR and titrated, eventually up to 120 mg daily.   5. Repeat echocardiogram 7/19 at the [facility] which showed RVSP was down from 74 mmHg to 54 mmHg.   6. While in [place], he had recurrent episodes of palpitations and went back to see his EP, Dr. Ellene Route. He underwent another AF ablation on 01/19/20. During the procedure, AF could not be induced and there was no clear evidence of arrhythmic substrates. His pre-procedure CT scan showed mild narrowing of his left superior pulmonary vein.   7. After this AF ablation, he had a TIA and also notes more SOB with exertion.   8. Noted to have atypical atrial flutter since his last AF ablation.   9. He was seen by Dr. Marca Ancona at [facility] in [place]. He underwent CT pulmonary vein protocol which showed concern for moderate left superior PV stenosis with significant ostial calcification. VQ scan was suggestive of decreased flow in the left lung apex. His coronaries were also heavily calcified on CT, unable to comment on the degree of stenosis in LAD or RCA region due to blooming artifact. Cardiolite stress test showed no evidence for ischemia.   10. Seen by Dr. Delrae Sawyers at [facility] 07/27/2020. Plan for PV stenting, but found to have too much calcium and was also noted to have good blood flow.   11. Kyle Harris has moved from [place] to [place], but also has a vacation home in [place] and plans to spend a significant amount of time in [place]. Therefore, he and his wife have presented to [facility] to establish [facility] care in 8/22.  His primary cardiology team is at the [facility] clinic in [place].      Interval History: He returns today and reports that he has gained ~ 20 lbs over the last year and now gets more dyspneic with exertion, he still walks an hour a day but notes that he has to walk slower than he did last year.  He denies any chest pain, denies any dizziness, denies syncope or presyncope or volume overload.  He and his wife believe that the weight gain is related to dietary indiscretion and less exercise.  He has  a CPET stress test scheduled with his cardiologist at [facility] clinic next month.      Past Medical/Surgical History:   1. Cardiac as above  2. TIAs in 1990s, another 4/21  3. HTN  4. Hyperlipidemia  5. BPH  6. GERD  7. OSA, on CPAP  8. COPD  9. s/p tonsillectomy      Family History:  No history of CHD.    Social History:   Married with 2 daughters.  Previously worked as a Arts administrator.  Former Financial trader.  No history of smoking.   Occasional alcohol intake.   No history of illicit drug use.    Medications:  Medications that the patient states to be currently taking   Medication Sig   ? atorvastatin 20 mg tablet Take 1 tablet (20 mg total) by mouth.   ? Coenzyme Q10 100 MG capsule Take 1 capsule (100 mg total) by mouth.   ? CRANBERRY EXTRACT PO Take by mouth.   ? docusate 100 mg capsule Take 2 capsules (200 mg total) by mouth.   ? ELIQUIS 5 MG tablet    ? famotidine 20 mg tablet Take 1 tablet (20 mg total) by mouth.   ? finasteride 5 mg tablet Take 1 tablet (5 mg total) by mouth.   ? metoprolol succinate 25 mg 24 hr tablet    ? Multiple Vitamins-Minerals (MULTI VITAMIN/MINERALS) TABS Take 1 tablet by mouth.   ? NIFEdipine (PROCARDIA XL) 60 mg 24 hr tablet Take 2 tablets (120 mg total) by mouth daily.   ? polyethylene glycol powder Take 17 g by mouth.   ? ramipril 10 mg capsule          Allergies: NKDA    Review of Systems:   Constitutional: No fevers or chills.   Skin: No rashes or bruising.   HEENT: No visual disturbances. No hearing loss, tinnitus, hoarseness, dysphagia, epistaxis.   Respiratory: No cough or wheeze.   Cardiac: As per HPI.   Vascular: No claudication or varicose veins.   GI: No nausea, vomiting or constipation.   GU: No dysuria or hematuria.   Musculoskeletal: No myalgias, bone pain or joint pain. Endocrine: No diabetes or thyroid disorder.   Hematologic: No bleeding or bruising issues.   Neurologic: No stroke, seizure, weakness.   Psychologic: No history of depression. No anxiety.      Physical Exam:  BP 128/75 (BP Location: Left arm, Patient Position: Sitting, Cuff Size: Regular)  ~ Pulse 67  ~ Temp 36.6 ?C (97.9 ?F) (Forehead)  ~ Resp 16  ~ Ht 6' 6'' (1.981 m)  ~ Wt (!) 227 lb 11.2 oz (103.3 kg)  ~ SpO2 95% Comment: room air ~ BMI 26.31 kg/m?   General: awake and alert, NAD  HEENT: normocephalic, EOMI, wearing facemask. Neck: AT/NC, supple, no thyroidmegaly  Pulmonary: Clear to auscultation with good air movement, no increased work of breathing   CV: RRR. Normal S1, S2 with 1/6 SEM at LUSB. No rub or gallop   Abdomen: Soft, NT/ND, normal BS, no masses. No liver edge palpable.  Extremities: WWP. Trace LE edema. No clubbing or cyanosis or edema  Neuro: Grossly intact, no focal deficits with normal mood and affect       Labs:    Cholesterol, Total ([facility])   Date Value Ref Range Status   06/01/2021 129 <200 mg/dL Final     LDL-Cholesterol ([facility])   Date Value Ref Range Status   06/01/2021 57 mg/dL (calc) Final  Comment:     Reference range: <100     Desirable range <100 mg/dL for primary prevention;    <70 mg/dL for patients with CHD or diabetic patients   with > or = 2 CHD risk factors.     LDL-C is now calculated using the Martin-Hopkins   calculation, which is a validated novel method providing   better accuracy than the Friedewald equation in the   estimation of LDL-C.   Horald Pollen et al. Lenox Ahr. 0454;098(11): 2061-2068   (http://education.[facility].com/faq/FAQ164)       HDL Cholesterol ([facility])   Date Value Ref Range Status   06/01/2021 59 > OR = 40 mg/dL Final     Triglycerides ([facility])   Date Value Ref Range Status   06/01/2021 45 <150 mg/dL Final      HSCRP 2.1 in 06/01/21      03/09/2021 ([facility]):  Na 137 K 5.1 Cl 103 BUN 19.7 Creat 0.98 Gluc 90 Pro-BNP 349 Albumin 4.8 AST 28 ALT 12 ALP 67 TBili 0.7  Hgb 14.3 Hct 42.7 Plt 183       Cardiodiagnostics:    Echocardiogram 05/08/22- reviewed by me:  1. Normal LV size, normal wall thickness, normal systolic function, mild LV diastolic dysfunction (grade I, impaired relaxation).   2. Left ventricular ejection fraction is approximately 65-70%.   3. Mildly increased RV wall thickness.   4. Moderate pulmonic valve regurgitation.   5. There is moderate aortic valve stenosis. The aortic valve area is 1.31 cm? (Indexed AVA is 0.57 cm?/m?).   6. Moderately enlarged right ventricular size and normal systolic function. (RV/LV ratio 1.28, fractional area change 40 %, TAPSE 3.1 cm, DTI 16.8 cm/s).   7. Normal left atrial size. Severe right atrial enlargement.   8. Tricuspid regurgitant jet inadequate to assess pulmonary artery systolic pressure.   9. A prior echo performed on 05/09/2021 was reviewed for comparison. Pulmonary veins not well imaged on this study.  10. Peak PR velocity is 2.3 m/sec, estimated mean PAP is 21 mmHg.  11. Moderately elevated pulmonary artery systolic pressure (47 mmHg).      Echocardiogram 05/19/2021:  2D AND M-MODE MEASUREMENTS (normal ranges within parentheses): Left Ventricle: Aorta/Left Atrium: IVSd (2D): 1.0 cm Aortic Root, d (2D): 4.0 cm LVPWd (2D): 1.1 cm Right Ventricle: LVIDd (2D): 4.6 cm RV major, A4C 9.7 cm LVIDs (2D): 2.4 cm RV basal minor, A4C 5.0 cm LV FS (2D): 48.6 % (>25%) RV Mid-cavity 4.8 cm LV SYSTOLIC FUNCTION BY 2D PLANIMETRY (MOD): EF-A4C View: 71 % LV Vol A4C: EDV 101 ml ESV 29 ml LV DIASTOLIC FUNCTION: MV Peak E: 84 cm/s e' medial 6.4 Decel Time: 336 msec Right Ventricle: RV fractional area change: 46 % RA Volumes: RA VOL A4C: 97 ml RA Vol A4C index: 42 ml/m? RA area: LA Volumes: LA Vol A4C: 29 ml LA Vol A4C index: 13 ml/m? LA area: Aortic Valve: AoV Max Vel: 1.60 m/s AoV Peak PG: 10 mmHg AoV Mean PG: LVOT Vmax: 0.68 m/s LVOT VTI: AoV VTI: LVOT Diameter: 2.3 cm AoV Area, Vmax: 1.77 cm? AoV Area, VTI: Tricuspid Valve and PA/RV Systolic Pressure: TR Max Velocity: 3.6 m/s RA Pressure: 15 mmHg RVSP/PASP: 66 mmHg Pulmonic Valve: PV Max Velocity: 0.9 m/s PV Max PG: 3 mmHg PV Mean PG: PV AT: 145 msec Systemic Veins: IVC Diameter: 2.83 cm   PHYSICIAN INTERPRETATION: LEFT VENTRICLE: Normal left ventricular size. Normal wall thickness. Normal systolic function. Visually estimated left ventricular ejection fraction 65-70%. Indeterminate diastolic function.  RIGHT VENTRICLE: Moderately enlarged right ventricular size (RV/LV ratio 1.35) and normal systolic function (Fractional area change 46 %) (DTI 15.9 cm/s). RV wall thickness is mildly increased. LEFT ATRIUM: Normal left atrial size. RIGHT ATRIUM: Mild right atrial enlargement. Right atrial pressure is estimated at 15 mmHg. MITRAL VALVE: Normal mitral valve thickness. No mitral annular calcification. No evidence of mitral valve regurgitation. TRICUSPID VALVE: Structurally normal tricuspid valve, with normal leaflet excursion. Trace tricuspid valve regurgitation. The tricuspid regurgitant velocity is 3.6 m/s, and with an assumed right atrial pressure of 15 mmHg, the estimated right ventricular systolic pressure is severely elevated at 66 mmHg. AORTIC VALVE: Moderately thickened/calcified aortic valve with restricted cusp opening. Trileaflet aortic valve morphology. Aortic valve area was not quantified by continuity equation on this study. No evidence of aortic valve regurgitation. PULMONIC VALVE: Moderate pulmonic valve regurgitation. Peak PR velocity is 2.4 m/sec, estimated mean PAP is 24 mmHg. PULMONARY ARTERY: The pulmonary artery is not visible. PULMONARY VEINS: There appears to be flow acclereration by color Doppler in the left upper pulmonary vein entry to the left atrium, by CW Doppler the peak velocity is 2.3 m/sec, PIG= 22 mmHg, mean gradient= 6 mmHg. SYSTEMIC VEINS: The inferior vena cava is dilated and exhibits less than 50% respiratory change. PERICARDIUM: There is no evidence of pericardial effusion. CONCLUSIONS  1. Normal LV size, normal wall thickness, normal systolic function, indeterminate diastolic function.  2. Left ventricular ejection fraction is approximately 65-70%.  3. Moderately enlarged right ventricular size and normal systolic function.  4. Normal left atrial size. Mild right atrial enlargement.  5. Aortic valve area was not quantified by continuity equation on this study.  6. Severely elevated pulmonary artery systolic pressure (66 mmHg).  7. Moderate pulmonic valve regurgitation.  8. There are no prior echocardiograms available for comparison.  9. Mildly increased RV wall thickness.  10. There appears to be flow acclereration by color Doppler in the left upper pulmonary vein entry to the left atrium, by CW Doppler the peak velocity is 2.3 m/sec, PIG= 22 mmHg, mean gradient= 6 mmHg.    Cardiopulmonary Stress Test 03/02/2021 [facility] [employer], per report):  Impressions:  1. Stress ECG was negative for ishemia. Patient reported moderate dyspnea at peak exercise.  2. Minor arrhythmias. PVC pairs, VT 3-5 beats. Intermittent 2nd degree type I AVB.  3. Below average peak VO2 (25.2 mL/min/kg, 99% predicted) with some pulmonary impairment to exercise. Submaximal cardiometabolic effort given (RER 1.03).   4. No clear cardiac output impairment based on continuous risk in O2 pulse (peak 107% predicted). Heart rate responded appropriately to exercise. Normal heart rate recovery.  5. Hypertensive resting blood pressure, but appropriate response to exercise.  6. Abnormal pulmonary responses include mild ventilatory impairment based on evidence of expiratory flow limitation due to encroachment of tidal breaths on maximum flow volume loop. On average 80% of each exhalation was flow limited peak exercise. Additionally desaturation with exercise (nadir 92%) and low resting end tidal CO2 but which did rise with exercise.  7. Reasons for low peak VO2 likely include a less than maximal effort, excess weight, and pulmonary limitation.  8. Compared to test of 12/28/19, results are similar with a peak VO2 falling less than 10% from 26.2 to 25.2 mL/min/kg or 101 to 99% predicted.    Cardiopulmonary Stress Test 07/27/2020 ([facility], per report):   Conclusion: 1) The data suggests a maximal exercise test, so maximal cardiorespiratory ??capacity could be determined. ??2) Based on the peak oxygen consumption (VO2) achieved,  functional capacity ??would be consistent with no functional impairment, with peak VO2 22.9?ml/kg/min (91% of predicted normals). Normative peak predicted VO2 values ??are corrected for age, gender, and weight. 3) There were maximal signs of a pulmonary limitation to exercise. ECG ??notable for baseline sinus rhythm with first degree AV block. Ectopy ??included multifocal PVCs, Ventricular couplets, Ventricular triplets, PACs, ?Type 2 AV block. ST segment changes included horizontal depression. Heart ??rate recovery was normal.?4) Aerobic reserve was low-normal suggesting physical deconditioning may be ??a potential limitation to exercise capacity.?5) Ventilatory reserve limits were approached at peak exercise without ??apparent pulmonary limitation to exercise. Resting spirometry suggests a ??mild obstruction. The MVV is normal. Exercise oscillatory ventilation ??criteria was not met. 6) The O2 pulse (an indicator of stroke volume) increased throughout exercise. ??7) No prior CPX available for comparison. This is the metabolic report for the Cardiopulmonary Exercise Test. The ??Stress ECG report is available on a separate report.     Lung Perfusion 06/28/2020 [facility], per report):  Findings: The perfusion images demonstrate mildly reduced perfusion to the left lung most pronounced in the upper lung zone, with otherwise relatively homogeneous distribution of radiotracer in the lungs. Differential pulmonary perfusion is as follows: Left upper zone: 6.5% Right upper zone: 14.7% Left mid zone: 17.0%  Right mid zone: 30.2% Left lower zone: 11.1%  Right lower zone: 20.5% Total left lung: 34.6% ?Total right lung: 65.4%   Impression:  1. ?Differential?pulmonary perfusion is 35% to the left lung and 65% to the right lung; see above discussion.     Myocardial Perfusion Imaging 05/31/2020 [facility], per report):   Nuclear stress EF: 69%. ? The study is normal. ? This is a low risk study. ? The left ventricular ejection fraction is hyperdynamic (>65%).  1. Normal perfusion without ischemia or infarction.  2. Normal LVEF, 69%.  3. Frequent PVCs and runs of non-sustained VT during stress (4 beat max duration).  4. Brief Type 2 AV block, Mobitz I (Wenckebach) in recovery.  5. Average functional capacity (6:00 min:s; 7.0 METS).  6. This is a low risk study. Stress Findings The patient exercised following the Bruce protocol. The patient reported shortness of breath during the stress test. The test was stopped because the patient complained of shortness of breath. Recovery time: 5 minutes. ECG Baseline ECG exhibits normal sinus rhythm.. Nuclear Study Quality Overall image quality is excellent. There is no nuclear artifact present. Response to Stress Arrhythmias during stress: occasional PVCsnon-sustained VT . The patient developed Wenckebach in recovery Arrhythmias were not?significant. ECG was interpretable and there was no significant change from baseline. Nuclear Measurements Study was gated. Isotope administration Rest isotope was administered with an IV injection of 10.1 mCi Tc78m Tetrofosmin. Rest SPECT images were obtained approximately 45 minutes post tracer injection. Stress isotope was administered with an IV injection of 31.7 mCi Tc72m Tetrofosmin at peak exercise. Stress SPECT images were obtained approximately 30 minutes post tracer injection. Rest Perfusion Rest perfusion normal. Stress Perfusion Stress perfusion normal. History and Indications Indication for Stress Test: Evaluation of extent and severity of coronary artery disease History: COPD, AFIB/AFLUTTER Cardiac Risk Factors: Hypertension and Lipids Symptoms: Chest Pain and SOB Overall Study Impression Myocardial perfusion is normal. The study is normal. This is a low risk study. Overall left ventricular systolic function was normal. LV cavity size is normal. Nuclear stress EF: 69%. The left ventricular ejection fraction is hyperdynamic (>65%). There is no prior study for comparison.     Cardiac CT Scan 05/24/20 ([facility], per report):  FINDINGS: Non-cardiac: See separate report from [name]. No thrombus noted in the left atrium or appendage. The pulmonary veins drain normally to the left atrium. There is significant calcification noted along the superior left atrium. Mild calcification at the ostium of the right superior pulmonary vein. Heavy, near circumferential calcification at the ostium of the left superior pulmonary?vein. RSPV: 18.5 x 16 mm, mild calcification at ostium. RIPV: 15 x 13.5 mm, very mild ostial calcification LSPV: 8.2 x 7.8 mm, near circumferential calcification at the ostium. More distal from the LA, the LSPV widens to 15.7 x 11 mm. Suspect?moderate stenosis. LIPV: 15 x 11 mm Calcium Score: 4198 Agatston units. Coronary Arteries: Right dominant with no anomalies LM: Extensive calcified plaque, suspect mild (<50%) stenosis. LAD system: Extensive calcified plaque in the proximal to mid LAD, unable to comment on severity of stenosis due to prominent blooming artifact. Circumflex system: Extensive proximal LCx calcification. Suspect mild (<50%) stenosis but difficult to be certain with prominent blooming artifact. RCA?system: There is extensive mixed plaque in the mid to distal RCA. Unable to comment on severity of stenosis due to prominent motion and blooming artifact. IMPRESSION:  1. Coronary artery calcium score 4198 Agatston units. This places the patient in the 97th percentile for age and gender, suggesting high risk for future cardiac events.  2. Very extensive coronary artery calcification makes identification of degree of coronary artery stenosis very difficult. Unable to comment on severity of disease in the LAD or RCA, probably mild stenosis in the proximal LCx.  3. Calcification as described above in the left atrium and especially around the junction of the left superior pulmonary vein with the left atrium. There does appear to be moderate stenosis of the left superior pulmonary vein. The other pulmonary veins do not appear to show significant stenosis.     Holter monitor on 04/07/20 [facility], per report):   Atypical atrial flutter with episodes of slow ventricular response. He was also noted to have sinus pauses, longest of which was 3.31 seconds. Echocardiogram 03/03/20 [facility], per report):   1. Normal left ventricular chamber size.  2. Calculated 2-D linear left ventricular ejection fraction 65 %.  3. No regional wall motion abnormalities.  4. Moderately enlarged right ventricular chamber size by visual estimate.  5. Mildly reduced right ventricular systolic function.  6. Estimated right ventricular systolic pressure 55 mmHg assuming right atrial pressure of 5  mmHg.  7. Severely enlarged right atrial size by visual estimate.  8. Sclerotic aortic valve. No regurgitation or significant stenosis from limited  interrogation. Mean gradient 4 mmHg.  9. Trivial mitral valve regurgitation.  10. Mild tricuspid valve regurgitation.  11. Moderate pulmonary valve regurgitation.  12. Normal inferior vena cava size with normal inspiratory collapse (>50%).  13. No pericardial effusion.  14. Compared to the report of 08/19/2019 no significant change has occurred. Prior exam was  limited; RVSP could not be estimated.      Impression: 76 y.o. male with  1. PAF s/p AF ablation x 3 and subsequent moderate left superior PV stenosis, not thought to be amenable to stenting by the team at [facility] given significant calcification  2. PAH, now on Nifedipine 120 mg daily  3. Atrial flutter, brief episodes noted on ambulatory cardiac monitoring, on MTP  4. Nonobstructive CAD with heavy calcification noted on cardiac CT 04/2020- per report (study not available for direct review at the time of the visit)  5. HTN on ramipril  6. Hyperlipidemia, on statin therapy.   7. OSA, using CPAP at night  8. Moderate aortic valve stenosis- calcific  9. Weight gain with decreased exercise capacity on 05/08/22.      Plan:   1. We had a long discussion today re dietary discretion and I advised him to be on a reduced complex carbohydrate diet and also discussed the importance of regular exercise.  2. Continue Nifedipine for [facility] which he feels is helping him.  3. Check BP at home regularly and track that, goal is to have SBP consistently less than 130 mmHg.   4. Continue current dose of ramipril for now.   5. Continue MTP and Eliquis for history of atrial arrhythmias.   6. Continue regular FU and management with his cardiology team at the [facility] clinic in [place].   7. Follow up in 12 months with repeat echocardiogram or anytime sooner if needed.

## 2023-05-07 ENCOUNTER — Ambulatory Visit: Payer: Commercial Managed Care - PPO | Attending: Cardiovascular Disease

## 2023-05-07 ENCOUNTER — Ambulatory Visit: Payer: Commercial Managed Care - PPO
